# Patient Record
Sex: Female | Born: 1950 | Race: Black or African American | Hispanic: No | Marital: Married | Smoking: Former smoker
Health system: Southern US, Community
[De-identification: ages and names within clinical notes are randomized; demographics above are authoritative.]

## PROBLEM LIST (undated history)

## (undated) DIAGNOSIS — I2510 Atherosclerotic heart disease of native coronary artery without angina pectoris: Secondary | ICD-10-CM

## (undated) DIAGNOSIS — I10 Essential (primary) hypertension: Secondary | ICD-10-CM

## (undated) DIAGNOSIS — E119 Type 2 diabetes mellitus without complications: Secondary | ICD-10-CM

## (undated) HISTORY — PX: CORONARY ARTERY BYPASS GRAFT: SHX141

## (undated) HISTORY — PX: TONSILLECTOMY: SUR1361

---

## 2015-12-29 ENCOUNTER — Observation Stay (HOSPITAL_BASED_OUTPATIENT_CLINIC_OR_DEPARTMENT_OTHER)
Admission: EM | Admit: 2015-12-29 | Discharge: 2015-12-31 | Disposition: A | Discharge status: Home / Self Care | Payer: BLUE CROSS/BLUE SHIELD | Attending: Family Medicine | Admitting: Family Medicine

## 2015-12-29 ENCOUNTER — Encounter (HOSPITAL_BASED_OUTPATIENT_CLINIC_OR_DEPARTMENT_OTHER): Payer: Self-pay | Admitting: *Deleted

## 2015-12-29 ENCOUNTER — Emergency Department (HOSPITAL_BASED_OUTPATIENT_CLINIC_OR_DEPARTMENT_OTHER): Payer: BLUE CROSS/BLUE SHIELD

## 2015-12-29 DIAGNOSIS — Z951 Presence of aortocoronary bypass graft: Secondary | ICD-10-CM | POA: Diagnosis not present

## 2015-12-29 DIAGNOSIS — I2510 Atherosclerotic heart disease of native coronary artery without angina pectoris: Secondary | ICD-10-CM | POA: Insufficient documentation

## 2015-12-29 DIAGNOSIS — J1000 Influenza due to other identified influenza virus with unspecified type of pneumonia: Secondary | ICD-10-CM | POA: Diagnosis not present

## 2015-12-29 DIAGNOSIS — D649 Anemia, unspecified: Secondary | ICD-10-CM | POA: Diagnosis present

## 2015-12-29 DIAGNOSIS — Z8249 Family history of ischemic heart disease and other diseases of the circulatory system: Secondary | ICD-10-CM | POA: Diagnosis not present

## 2015-12-29 DIAGNOSIS — R509 Fever, unspecified: Secondary | ICD-10-CM | POA: Diagnosis present

## 2015-12-29 DIAGNOSIS — W19XXXA Unspecified fall, initial encounter: Secondary | ICD-10-CM | POA: Diagnosis not present

## 2015-12-29 DIAGNOSIS — Z87891 Personal history of nicotine dependence: Secondary | ICD-10-CM | POA: Diagnosis not present

## 2015-12-29 DIAGNOSIS — I10 Essential (primary) hypertension: Secondary | ICD-10-CM | POA: Insufficient documentation

## 2015-12-29 DIAGNOSIS — R55 Syncope and collapse: Secondary | ICD-10-CM | POA: Insufficient documentation

## 2015-12-29 DIAGNOSIS — Z7982 Long term (current) use of aspirin: Secondary | ICD-10-CM | POA: Diagnosis not present

## 2015-12-29 DIAGNOSIS — I4510 Unspecified right bundle-branch block: Secondary | ICD-10-CM | POA: Insufficient documentation

## 2015-12-29 DIAGNOSIS — E1165 Type 2 diabetes mellitus with hyperglycemia: Secondary | ICD-10-CM | POA: Diagnosis not present

## 2015-12-29 DIAGNOSIS — Z79899 Other long term (current) drug therapy: Secondary | ICD-10-CM | POA: Diagnosis not present

## 2015-12-29 DIAGNOSIS — S92322A Displaced fracture of second metatarsal bone, left foot, initial encounter for closed fracture: Secondary | ICD-10-CM | POA: Insufficient documentation

## 2015-12-29 DIAGNOSIS — E860 Dehydration: Secondary | ICD-10-CM | POA: Insufficient documentation

## 2015-12-29 DIAGNOSIS — S92332A Displaced fracture of third metatarsal bone, left foot, initial encounter for closed fracture: Secondary | ICD-10-CM | POA: Diagnosis not present

## 2015-12-29 DIAGNOSIS — N289 Disorder of kidney and ureter, unspecified: Secondary | ICD-10-CM | POA: Insufficient documentation

## 2015-12-29 DIAGNOSIS — Z794 Long term (current) use of insulin: Secondary | ICD-10-CM | POA: Insufficient documentation

## 2015-12-29 DIAGNOSIS — J189 Pneumonia, unspecified organism: Secondary | ICD-10-CM | POA: Insufficient documentation

## 2015-12-29 DIAGNOSIS — E785 Hyperlipidemia, unspecified: Secondary | ICD-10-CM | POA: Insufficient documentation

## 2015-12-29 HISTORY — DX: Essential (primary) hypertension: I10

## 2015-12-29 HISTORY — DX: Type 2 diabetes mellitus without complications: E11.9

## 2015-12-29 HISTORY — DX: Atherosclerotic heart disease of native coronary artery without angina pectoris: I25.10

## 2015-12-29 LAB — COMPREHENSIVE METABOLIC PANEL
ALT: 17 U/L (ref 14–54)
AST: 19 U/L (ref 15–41)
Albumin: 3.9 g/dL (ref 3.5–5.0)
Alkaline Phosphatase: 43 U/L (ref 38–126)
Anion gap: 11 (ref 5–15)
BUN: 22 mg/dL — AB (ref 6–20)
CHLORIDE: 102 mmol/L (ref 101–111)
CO2: 22 mmol/L (ref 22–32)
CREATININE: 0.84 mg/dL (ref 0.44–1.00)
Calcium: 8.9 mg/dL (ref 8.9–10.3)
GFR calc Af Amer: >60 mL/min (ref >60)
Glucose, Bld: 316 mg/dL — ABNORMAL HIGH (ref 65–99)
Potassium: 4.5 mmol/L (ref 3.5–5.1)
Sodium: 135 mmol/L (ref 135–145)
Total Bilirubin: 0.4 mg/dL (ref 0.3–1.2)
Total Protein: 7.8 g/dL (ref 6.5–8.1)

## 2015-12-29 LAB — CBC WITH DIFFERENTIAL/PLATELET
Basophils Absolute: 0 10*3/uL (ref 0.0–0.1)
Basophils Relative: 0 %
EOS ABS: 0 10*3/uL (ref 0.0–0.7)
EOS PCT: 0 %
HCT: 35.8 % — ABNORMAL LOW (ref 36.0–46.0)
Hemoglobin: 11.8 g/dL — ABNORMAL LOW (ref 12.0–15.0)
LYMPHS ABS: 0.9 10*3/uL (ref 0.7–4.0)
Lymphocytes Relative: 11 %
MCH: 27.9 pg (ref 26.0–34.0)
MCHC: 33 g/dL (ref 30.0–36.0)
MCV: 84.6 fL (ref 78.0–100.0)
MONOS PCT: 8 %
Monocytes Absolute: 0.6 10*3/uL (ref 0.1–1.0)
Neutro Abs: 6.2 10*3/uL (ref 1.7–7.7)
Neutrophils Relative %: 81 %
PLATELETS: 258 10*3/uL (ref 150–400)
RBC: 4.23 MIL/uL (ref 3.87–5.11)
RDW: 13.8 % (ref 11.5–15.5)
WBC: 7.7 10*3/uL (ref 4.0–10.5)

## 2015-12-29 LAB — URINALYSIS, ROUTINE W REFLEX MICROSCOPIC
Bilirubin Urine: NEGATIVE
GLUCOSE, UA: 500 mg/dL — AB
HGB URINE DIPSTICK: NEGATIVE
Ketones, ur: NEGATIVE mg/dL
Nitrite: NEGATIVE
PH: 5.5 (ref 5.0–8.0)
Protein, ur: NEGATIVE mg/dL
SPECIFIC GRAVITY, URINE: 1.013 (ref 1.005–1.030)

## 2015-12-29 LAB — URINE MICROSCOPIC-ADD ON

## 2015-12-29 LAB — I-STAT CG4 LACTIC ACID, ED: LACTIC ACID, VENOUS: 1.32 mmol/L (ref 0.5–2.0)

## 2015-12-29 LAB — CBG MONITORING, ED: Glucose-Capillary: 297 mg/dL — ABNORMAL HIGH (ref 65–99)

## 2015-12-29 MED ORDER — SODIUM CHLORIDE 0.9 % IV BOLUS (SEPSIS): 500.0000 mL | INTRAVENOUS | Status: DC

## 2015-12-29 MED ORDER — ACETAMINOPHEN 500 MG PO TABS: 1000.0000 mg | ORAL_TABLET | Freq: Once | ORAL | Status: DC

## 2015-12-29 MED ORDER — DEXTROSE 5 % IV SOLN
1.0000 g | Freq: Once | INTRAVENOUS | Status: AC
  Administered 2015-12-30: 1 g via INTRAVENOUS
  Filled 2015-12-29: qty 10

## 2015-12-29 MED ORDER — SODIUM CHLORIDE 0.9 % IV BOLUS (SEPSIS)
1000.0000 mL | INTRAVENOUS | Status: AC
  Administered 2015-12-29 (×2): 1000 mL via INTRAVENOUS

## 2015-12-29 MED ORDER — ACETAMINOPHEN 325 MG PO TABS
ORAL_TABLET | ORAL | Status: AC
  Filled 2015-12-29: qty 1

## 2015-12-29 MED ORDER — ACETAMINOPHEN 325 MG PO TABS
325.0000 mg | ORAL_TABLET | Freq: Once | ORAL | Status: AC
  Administered 2015-12-29: 325 mg via ORAL

## 2015-12-29 MED ORDER — ACETAMINOPHEN 325 MG PO TABS
650.0000 mg | ORAL_TABLET | Freq: Once | ORAL | Status: AC
  Administered 2015-12-29: 650 mg via ORAL
  Filled 2015-12-29: qty 2

## 2015-12-29 MED ORDER — DEXTROSE 5 % IV SOLN
500.0000 mg | Freq: Once | INTRAVENOUS | Status: AC
  Administered 2015-12-29: 500 mg via INTRAVENOUS

## 2015-12-29 MED ORDER — AZITHROMYCIN 500 MG IV SOLR
INTRAVENOUS | Status: AC
  Filled 2015-12-29: qty 500

## 2015-12-29 NOTE — Progress Notes (Signed)
[facility]. [facility] with weakness, fatigue, fevers, dry cough, and syncope x2. Febrile, but vitals o/w stable and saturating well on rm air. Labs with elevated ZOX:[id number]:creatinine ratio and hyperglycemia. CXR with mild vasc congestion, no infiltrate. Found to have 2 metatarsal fxs suffered during one of the syncopal episodes. [facility] with flu panel pending.

## 2015-12-29 NOTE — ED Notes (Addendum)
Pt reports feeling weak and dizzy the last few days. States she "went down" (?loc) last night while getting up to go to the bathroom and may have twisted her left foot. Unable to bear weight on left foot. Reports dry cough and fatigue. Temp 101.3 in triage

## 2015-12-29 NOTE — ED Provider Notes (Addendum)
CSN: [id number]     Arrival date & time 12/29/15  1958 History  By signing my name below, I, Rohini Rajnarayanan, attest that this documentation has been prepared under the direction and in the presence of Doug Sou, MD Electronically Signed: Charlean Merl, ED Scribe 12/29/2015 at 8:51 PM.   Chief Complaint  Patient presents with  . Leg Pain  . Weakness   The history is provided by the patient. No language interpreter was used.   HPI Comments: Susan Marquez is a 65 y.o. female with a pmhx of HTN, DM and CAD, with a pshx of CABG, who presents to the Emergency Department complaining of a dry cough, subjective fever, fatigue, HA across both eyes, weakness and light-headedness which began around a week ago and increased 2 days ago. Pt also c/o left foot pain after LOC x2 last night while getting up to go to the bathroom. Pt first lost consciousness during the night. Then she regained consciousness to use the bathroom. Once she sat on the commode, she fell and lost consciousness again. Pt states that she may have twisted her foot because she is unable to bear weight on her foot today. Pt is still feeling lightheaded and dehydrated. She feels much improved today over yesterday. No treatment prior to coming here. Pt denies any myalgias, dysuria, or abdominal pain. Pt denies any smoking or alcohol use. Pt's PCP is Dr. Jacob Moores at [facility].   Past Medical History  Diagnosis Date  . Hypertension   . Diabetes mellitus without complication (HCC)   . Coronary artery disease    Past Surgical History  Procedure Laterality Date  . Coronary artery bypass graft    . Tonsillectomy     No family history on file. Social History  Substance Use Topics  . Smoking status: Former Games developer  . Smokeless tobacco: Never Used  . Alcohol Use: No   OB History    No data available     Review of Systems  Constitutional: Positive for fever.  HENT: Negative.   Respiratory: Positive for  cough.   Cardiovascular: Negative.   Gastrointestinal: Negative.   Musculoskeletal: Positive for arthralgias.       Left foot pain after fall  Skin: Negative.   Allergic/Immunologic: Positive for immunocompromised state.  Neurological: Positive for light-headedness.  Psychiatric/Behavioral: Negative.   All other systems reviewed and are negative.   Allergies  Amlodipine  Home Medications   Prior to Admission medications   Medication Sig Start Date End Date Taking? Authorizing Provider  aspirin 325 MG tablet Take 325 mg by mouth daily.   Yes Historical Provider, MD  cloNIDine (CATAPRES) 0.2 MG tablet Take 0.2 mg by mouth 2 (two) times daily.   Yes Historical Provider, MD  fluticasone (FLONASE) 50 MCG/ACT nasal spray Place 2 sprays into both nostrils daily.   Yes Historical Provider, MD  gemfibrozil (LOPID) 600 MG tablet Take 600 mg by mouth 2 (two) times daily before a meal.   Yes Historical Provider, MD  glipiZIDE (GLUCOTROL) 10 MG tablet Take 10 mg by mouth daily before breakfast.   Yes Historical Provider, MD  insulin glargine (LANTUS) 100 UNIT/ML injection Inject into the skin every morning.   Yes Historical Provider, MD  lisinopril (PRINIVIL,ZESTRIL) 40 MG tablet Take 40 mg by mouth daily.   Yes Historical Provider, MD  metFORMIN (GLUCOPHAGE) 1000 MG tablet Take 1,000 mg by mouth 2 (two) times daily with a meal.   Yes Historical Provider, MD  metoprolol succinate (  TOPROL-XL) 25 MG 24 hr tablet Take 25 mg by mouth 2 (two) times daily.   Yes Historical Provider, MD  nitroGLYCERIN (NITROSTAT) 0.4 MG SL tablet Place 0.4 mg under the tongue every 5 (five) minutes as needed for chest pain.   Yes Historical Provider, MD  omeprazole (PRILOSEC) 20 MG capsule Take 20 mg by mouth daily.   Yes Historical Provider, MD   BP 174/80 mmHg  Pulse 101  Temp(Src) 101.3 F (38.5 C) (Oral)  Resp 20  Ht 5\' 4"  (1.626 m)  Wt 160 lb (72.576 kg)  BMI 27.45 kg/m2  SpO2 100% Physical Exam   Constitutional: She is oriented to person, place, and time. She appears well-developed and well-nourished. No distress.  HENT:  Head: Normocephalic and atraumatic.  Mouth/Throat: Oropharynx is clear and moist.  BiLateral tympanic membranes normal  Eyes: Conjunctivae are normal. Pupils are equal, round, and reactive to light.  Neck: Neck supple. No tracheal deviation present. No thyromegaly present.  No signs of meningitis  Cardiovascular: Normal rate and regular rhythm.   No murmur heard. Pulmonary/Chest: Effort normal and breath sounds normal.  Abdominal: Soft. Bowel sounds are normal. She exhibits no distension. There is no tenderness.  Musculoskeletal: Normal range of motion. She exhibits no edema or tenderness.  Neurological: She is alert and oriented to person, place, and time. No cranial nerve deficit. Coordination normal.  Skin: Skin is warm and dry. No rash noted.  Psychiatric: She has a normal mood and affect. Her behavior is normal. Thought content normal.  Nursing note and vitals reviewed.   ED Course  Procedures  DIAGNOSTIC STUDIES: Oxygen Saturation is 100% on RA, normal by my interpretation.    COORDINATION OF CARE: 8:42 PM-Discussed treatment plan which includes DG Chest, I-stat CG4 Lactic Acid, Blood work, UA, acetaminophen,  EKG, CBG monitoring, and cardiac monitoring, with pt at bedside and pt agreed to plan.   Labs Review Labs Reviewed  CBG MONITORING, ED    Imaging Review No results found. I have personally reviewed and evaluated these images and lab results as part of my medical decision-making.   EKG Interpretation None     ED ECG REPORT   Date: 12/29/2015  Rate: 90  Rhythm: normal sinus rhythm  QRS Axis: normal  Intervals: normal  ST/T Wave abnormalities: nonspecific T wave changes  Conduction Disutrbances:right bundle branch block  Narrative Interpretation:   Old EKG Reviewed: none available  I have personally reviewed the EKG tracing and  agree with the computerized printout as noted. Chest x-ray viewed by me Results for orders placed or performed during the hospital encounter of 12/29/15  Comprehensive metabolic panel  Result Value Ref Range   Sodium 135 135 - 145 mmol/L   Potassium 4.5 3.5 - 5.1 mmol/L   Chloride 102 101 - 111 mmol/L   CO2 22 22 - 32 mmol/L   Glucose, Bld 316 (H) 65 - 99 mg/dL   BUN 22 (H) 6 - 20 mg/dL   Creatinine, Ser 1.61 0.44 - 1.00 mg/dL   Calcium 8.9 8.9 - 09.6 mg/dL   Total Protein 7.8 6.5 - 8.1 g/dL   Albumin 3.9 3.5 - 5.0 g/dL   AST 19 15 - 41 U/L   ALT 17 14 - 54 U/L   Alkaline Phosphatase 43 38 - 126 U/L   Total Bilirubin 0.4 0.3 - 1.2 mg/dL   GFR calc non Af Amer >60 >60 mL/min   GFR calc Af Amer >60 >60 mL/min   Anion gap  11 5 - 15  CBC WITH DIFFERENTIAL  Result Value Ref Range   WBC 7.7 4.0 - 10.5 K/uL   RBC 4.23 3.87 - 5.11 MIL/uL   Hemoglobin 11.8 (L) 12.0 - 15.0 g/dL   HCT 45.4 (L) 09.8 - 11.9 %   MCV 84.6 78.0 - 100.0 fL   MCH 27.9 26.0 - 34.0 pg   MCHC 33.0 30.0 - 36.0 g/dL   RDW 14.7 82.9 - 56.2 %   Platelets 258 150 - 400 K/uL   Neutrophils Relative % 81 %   Neutro Abs 6.2 1.7 - 7.7 K/uL   Lymphocytes Relative 11 %   Lymphs Abs 0.9 0.7 - 4.0 K/uL   Monocytes Relative 8 %   Monocytes Absolute 0.6 0.1 - 1.0 K/uL   Eosinophils Relative 0 %   Eosinophils Absolute 0.0 0.0 - 0.7 K/uL   Basophils Relative 0 %   Basophils Absolute 0.0 0.0 - 0.1 K/uL  Urinalysis, Routine w reflex microscopic (not at [facility])  Result Value Ref Range   Color, Urine YELLOW YELLOW   APPearance CLEAR CLEAR   Specific Gravity, Urine 1.013 1.005 - 1.030   pH 5.5 5.0 - 8.0   Glucose, UA 500 (A) NEGATIVE mg/dL   Hgb urine dipstick NEGATIVE NEGATIVE   Bilirubin Urine NEGATIVE NEGATIVE   Ketones, ur NEGATIVE NEGATIVE mg/dL   Protein, ur NEGATIVE NEGATIVE mg/dL   Nitrite NEGATIVE NEGATIVE   Leukocytes, UA SMALL (A) NEGATIVE  Urine microscopic-add on  Result Value Ref Range   Squamous Epithelial  / LPF 0-5 (A) NONE SEEN   WBC, UA 0-5 0 - 5 WBC/hpf   RBC / HPF 0-5 0 - 5 RBC/hpf   Bacteria, UA RARE (A) NONE SEEN  CBG monitoring, ED  Result Value Ref Range   Glucose-Capillary 297 (H) 65 - 99 mg/dL  I-Stat CG4 Lactic Acid, ED  (not at  [facility])  Result Value Ref Range   Lactic Acid, Venous 1.32 0.5 - 2.0 mmol/L   Dg Chest 2 View  12/29/2015  CLINICAL DATA:  Acute onset of dry cough, subjective fever, fatigue, headache, generalized weakness and lightheadedness. Initial encounter. EXAM: CHEST  2 VIEW COMPARISON:  None. FINDINGS: The lungs are well-aerated. Mild bibasilar atelectasis is noted. Mild vascular congestion is seen. There is no evidence of focal opacification, pleural effusion or pneumothorax. The heart is normal in size; the patient is status post median sternotomy, with evidence of prior CABG. No acute osseous abnormalities are seen. IMPRESSION: Mild bibasilar atelectasis noted.  Mild vascular congestion seen. Electronically Signed   By: Roanna Raider M.D.   On: 12/29/2015 22:13   Ct Head Wo Contrast  12/29/2015  CLINICAL DATA:  Headaches and fever EXAM: CT HEAD WITHOUT CONTRAST TECHNIQUE: Contiguous axial images were obtained from the base of the skull through the vertex without intravenous contrast. COMPARISON:  None. FINDINGS: The bony calvarium is intact. The ventricles are of normal size and configuration. No findings to suggest acute hemorrhage, acute infarction or space-occupying mass lesion are noted. IMPRESSION: No acute intracranial abnormality noted. Electronically Signed   By: Alcide Clever M.D.   On: 12/29/2015 22:12   Dg Foot Complete Left  12/29/2015  CLINICAL DATA:  Recent fall with left foot pain, initial encounter EXAM: LEFT FOOT - COMPLETE 3+ VIEW COMPARISON:  None. FINDINGS: Fracture is noted through the base of the second and third metatarsals. Only minimal displacement is noted. No Lisfranc dislocation is noted. No other fractures are seen. IMPRESSION: Fractures  through the base of the second and third metatarsals with only minimal displacement. Electronically Signed   By: Alcide CleverMark  Lukens M.D.   On: 12/29/2015 22:13    MDM  In light of syncope, fractured foot as result of syncope, febrile illness in this immunocompromised I consulted with Dr.Opyd from hospitalist service. Telemetry She is treated for community-acquired pneumonia in light of cough. Flu panel pending. Patient is dehydrated in light of prerenal azotemia and hyperglycemia. Postop shoe placed on patient. Suggest orthopedic consult either as outpatient or while in hospital Final diagnoses:  None  Diagnosis #1 febrile illness #2 syncope #3 hyperglycemia #4 dehydration #5 renal insufficiency #6 closed fracture of left foot I personally performed the services described in this documentation, which was scribed in my presence. The recorded information has been reviewed and considered.       Doug SouSam Camala Talwar, MD 12/29/15 2340  Doug SouSam Tamirah George, MD 12/30/15 [phone]

## 2015-12-30 ENCOUNTER — Observation Stay (HOSPITAL_COMMUNITY): Payer: BLUE CROSS/BLUE SHIELD

## 2015-12-30 ENCOUNTER — Encounter (HOSPITAL_COMMUNITY): Payer: Self-pay | Admitting: Internal Medicine

## 2015-12-30 DIAGNOSIS — J189 Pneumonia, unspecified organism: Secondary | ICD-10-CM

## 2015-12-30 DIAGNOSIS — R55 Syncope and collapse: Secondary | ICD-10-CM

## 2015-12-30 DIAGNOSIS — E1165 Type 2 diabetes mellitus with hyperglycemia: Secondary | ICD-10-CM | POA: Diagnosis not present

## 2015-12-30 DIAGNOSIS — R509 Fever, unspecified: Secondary | ICD-10-CM

## 2015-12-30 DIAGNOSIS — D649 Anemia, unspecified: Secondary | ICD-10-CM | POA: Diagnosis present

## 2015-12-30 LAB — COMPREHENSIVE METABOLIC PANEL
ALBUMIN: 3.2 g/dL — AB (ref 3.5–5.0)
ALT: 14 U/L (ref 14–54)
ANION GAP: 9 (ref 5–15)
AST: 17 U/L (ref 15–41)
Alkaline Phosphatase: 37 U/L — ABNORMAL LOW (ref 38–126)
BUN: 9 mg/dL (ref 6–20)
CO2: 22 mmol/L (ref 22–32)
Calcium: 8.3 mg/dL — ABNORMAL LOW (ref 8.9–10.3)
Chloride: 109 mmol/L (ref 101–111)
Creatinine, Ser: 0.65 mg/dL (ref 0.44–1.00)
GFR calc Af Amer: >60 mL/min (ref >60)
GFR calc non Af Amer: >60 mL/min (ref >60)
GLUCOSE: 219 mg/dL — AB (ref 65–99)
POTASSIUM: 3.6 mmol/L (ref 3.5–5.1)
SODIUM: 140 mmol/L (ref 135–145)
Total Bilirubin: 0.2 mg/dL — ABNORMAL LOW (ref 0.3–1.2)
Total Protein: 6.2 g/dL — ABNORMAL LOW (ref 6.5–8.1)

## 2015-12-30 LAB — TROPONIN I: Troponin I: <0.03 ng/mL (ref <0.031)

## 2015-12-30 LAB — CBC WITH DIFFERENTIAL/PLATELET
Basophils Absolute: 0 10*3/uL (ref 0.0–0.1)
Basophils Relative: 0 %
Eosinophils Absolute: 0 10*3/uL (ref 0.0–0.7)
Eosinophils Relative: 0 %
HEMATOCRIT: 31.9 % — AB (ref 36.0–46.0)
Hemoglobin: 10.2 g/dL — ABNORMAL LOW (ref 12.0–15.0)
LYMPHS PCT: 37 %
Lymphs Abs: 1.7 10*3/uL (ref 0.7–4.0)
MCH: 26.9 pg (ref 26.0–34.0)
MCHC: 32 g/dL (ref 30.0–36.0)
MCV: 84.2 fL (ref 78.0–100.0)
MONO ABS: 0.4 10*3/uL (ref 0.1–1.0)
MONOS PCT: 9 %
NEUTROS ABS: 2.4 10*3/uL (ref 1.7–7.7)
Neutrophils Relative %: 54 %
Platelets: 215 10*3/uL (ref 150–400)
RBC: 3.79 MIL/uL — ABNORMAL LOW (ref 3.87–5.11)
RDW: 13.9 % (ref 11.5–15.5)
WBC: 4.5 10*3/uL (ref 4.0–10.5)

## 2015-12-30 LAB — INFLUENZA PANEL BY PCR (TYPE A & B)
H1N1FLUPCR: NOT DETECTED
Influenza A By PCR: NEGATIVE
Influenza B By PCR: POSITIVE — AB

## 2015-12-30 LAB — GLUCOSE, CAPILLARY
GLUCOSE-CAPILLARY: 211 mg/dL — AB (ref 65–99)
GLUCOSE-CAPILLARY: 86 mg/dL (ref 65–99)
GLUCOSE-CAPILLARY: 70 mg/dL (ref 65–99)
Glucose-Capillary: 196 mg/dL — ABNORMAL HIGH (ref 65–99)
Glucose-Capillary: 209 mg/dL — ABNORMAL HIGH (ref 65–99)

## 2015-12-30 LAB — STREP PNEUMONIAE URINARY ANTIGEN: Strep Pneumo Urinary Antigen: NEGATIVE

## 2015-12-30 MED ORDER — DEXTROSE 5 % IV SOLN
1.0000 g | INTRAVENOUS | Status: DC
  Filled 2015-12-30: qty 10

## 2015-12-30 MED ORDER — GEMFIBROZIL 600 MG PO TABS
600.0000 mg | ORAL_TABLET | Freq: Two times a day (BID) | ORAL | Status: DC
  Administered 2015-12-30 – 2015-12-31 (×3): 600 mg via ORAL
  Filled 2015-12-30 (×5): qty 1

## 2015-12-30 MED ORDER — FLUTICASONE PROPIONATE 50 MCG/ACT NA SUSP
2.0000 | Freq: Every day | NASAL | Status: DC
  Filled 2015-12-30: qty 16

## 2015-12-30 MED ORDER — ONDANSETRON HCL 4 MG/2ML IJ SOLN: 4.0000 mg | Freq: Four times a day (QID) | INTRAMUSCULAR | Status: DC | PRN

## 2015-12-30 MED ORDER — TRAMADOL HCL 50 MG PO TABS
50.0000 mg | ORAL_TABLET | Freq: Four times a day (QID) | ORAL | Status: DC
  Administered 2015-12-30 – 2015-12-31 (×4): 50 mg via ORAL
  Filled 2015-12-30 (×5): qty 1

## 2015-12-30 MED ORDER — CLONIDINE HCL 0.2 MG PO TABS
0.2000 mg | ORAL_TABLET | Freq: Two times a day (BID) | ORAL | Status: DC
  Administered 2015-12-30 – 2015-12-31 (×3): 0.2 mg via ORAL
  Filled 2015-12-30 (×3): qty 1

## 2015-12-30 MED ORDER — DIGOXIN 0.25 MG/ML IJ SOLN: 0.2500 mg | Freq: Once | INTRAMUSCULAR | Status: DC

## 2015-12-30 MED ORDER — PANTOPRAZOLE SODIUM 40 MG PO TBEC
40.0000 mg | DELAYED_RELEASE_TABLET | Freq: Every day | ORAL | Status: DC
  Administered 2015-12-30 – 2015-12-31 (×2): 40 mg via ORAL
  Filled 2015-12-30 (×2): qty 1

## 2015-12-30 MED ORDER — TRAMADOL HCL 50 MG PO TABS
50.0000 mg | ORAL_TABLET | Freq: Two times a day (BID) | ORAL | Status: DC | PRN
  Administered 2015-12-30: 50 mg via ORAL
  Filled 2015-12-30: qty 1

## 2015-12-30 MED ORDER — NITROGLYCERIN 0.4 MG SL SUBL: 0.4000 mg | SUBLINGUAL_TABLET | SUBLINGUAL | Status: DC | PRN

## 2015-12-30 MED ORDER — INSULIN ASPART 100 UNIT/ML ~~LOC~~ SOLN
0.0000 [IU] | Freq: Three times a day (TID) | SUBCUTANEOUS | Status: DC
  Administered 2015-12-30: 2 [IU] via SUBCUTANEOUS
  Administered 2015-12-30: 3 [IU] via SUBCUTANEOUS
  Administered 2015-12-31 (×2): 1 [IU] via SUBCUTANEOUS

## 2015-12-30 MED ORDER — SODIUM CHLORIDE 0.9 % IV BOLUS (SEPSIS)
500.0000 mL | Freq: Once | INTRAVENOUS | Status: AC
  Administered 2015-12-30: 500 mL via INTRAVENOUS

## 2015-12-30 MED ORDER — ONDANSETRON HCL 4 MG PO TABS: 4.0000 mg | ORAL_TABLET | Freq: Four times a day (QID) | ORAL | Status: DC | PRN

## 2015-12-30 MED ORDER — GLIPIZIDE ER 10 MG PO TB24
10.0000 mg | ORAL_TABLET | Freq: Every day | ORAL | Status: DC
  Administered 2015-12-30: 10 mg via ORAL
  Filled 2015-12-30: qty 1

## 2015-12-30 MED ORDER — INSULIN GLARGINE 100 UNIT/ML ~~LOC~~ SOLN
60.0000 [IU] | Freq: Every day | SUBCUTANEOUS | Status: DC
  Administered 2015-12-30 – 2015-12-31 (×2): 60 [IU] via SUBCUTANEOUS
  Filled 2015-12-30 (×2): qty 0.6

## 2015-12-30 MED ORDER — DIGOXIN 0.25 MG/ML IJ SOLN: 0.1250 mg | Freq: Every day | INTRAMUSCULAR | Status: DC

## 2015-12-30 MED ORDER — OXYCODONE-ACETAMINOPHEN 5-325 MG PO TABS
1.0000 | ORAL_TABLET | ORAL | Status: DC | PRN
  Administered 2015-12-30 – 2015-12-31 (×2): 1 via ORAL
  Filled 2015-12-30 (×2): qty 1

## 2015-12-30 MED ORDER — ACETAMINOPHEN 650 MG RE SUPP: 650.0000 mg | Freq: Four times a day (QID) | RECTAL | Status: DC | PRN

## 2015-12-30 MED ORDER — ACETAMINOPHEN 325 MG PO TABS: 650.0000 mg | ORAL_TABLET | Freq: Four times a day (QID) | ORAL | Status: DC | PRN

## 2015-12-30 MED ORDER — SODIUM CHLORIDE 0.9 % IV SOLN
INTRAVENOUS | Status: DC
Start: 2015-12-30 — End: 2015-12-30
  Administered 2015-12-30 (×2): via INTRAVENOUS

## 2015-12-30 MED ORDER — LISINOPRIL 40 MG PO TABS
40.0000 mg | ORAL_TABLET | Freq: Every day | ORAL | Status: DC
  Administered 2015-12-30 – 2015-12-31 (×2): 40 mg via ORAL
  Filled 2015-12-30 (×2): qty 1

## 2015-12-30 MED ORDER — ASPIRIN 325 MG PO TABS
325.0000 mg | ORAL_TABLET | Freq: Every day | ORAL | Status: DC
Start: 2015-12-30 — End: 2015-12-31
  Administered 2015-12-30 – 2015-12-31 (×2): 325 mg via ORAL
  Filled 2015-12-30 (×2): qty 1

## 2015-12-30 MED ORDER — DEXTROSE 5 % IV SOLN
500.0000 mg | INTRAVENOUS | Status: DC
  Filled 2015-12-30: qty 500

## 2015-12-30 MED ORDER — METOPROLOL SUCCINATE ER 25 MG PO TB24
25.0000 mg | ORAL_TABLET | Freq: Two times a day (BID) | ORAL | Status: DC
  Administered 2015-12-30 – 2015-12-31 (×3): 25 mg via ORAL
  Filled 2015-12-30 (×3): qty 1

## 2015-12-30 MED ORDER — ENOXAPARIN SODIUM 40 MG/0.4ML ~~LOC~~ SOLN
40.0000 mg | Freq: Every day | SUBCUTANEOUS | Status: DC
  Administered 2015-12-30 – 2015-12-31 (×2): 40 mg via SUBCUTANEOUS
  Filled 2015-12-30 (×2): qty 0.4

## 2015-12-30 MED ORDER — OSELTAMIVIR PHOSPHATE 75 MG PO CAPS
75.0000 mg | ORAL_CAPSULE | Freq: Every day | ORAL | Status: DC
Start: 2015-12-30 — End: 2015-12-31
  Administered 2015-12-30 – 2015-12-31 (×2): 75 mg via ORAL
  Filled 2015-12-30 (×3): qty 1

## 2015-12-30 NOTE — Progress Notes (Signed)
11:52 AM I agree with HPI/GPe and A/P per Dr. Dr. Toniann FailKakrakandy  65 y/o ? CAd s/p CABG 1994 [facility]-sees [facility]/ cardiology Lexiscan DM tyII on insulin Last A1c document to 11.25/2015 HTN    x-rays revealed fracture of the second and third metatarsal at the base.  EKG shows normal sinus rhythm with RBBB and QTC of 486 ms.  Patient was found to be febrile in the ER. Chest x-ray only shows congestion.  But patient has been having subjective feeling of fever and chills last 2 days with productive cough   Patient Active Problem List   Diagnosis Date Noted  . CAP (community acquired pneumonia) 12/30/2015  . Syncope 12/30/2015  . Normocytic anemia 12/30/2015  . Type 2 diabetes mellitus with hyperglycemia (HCC) 12/30/2015  . Febrile illness 12/29/2015   P FLU +-d/c Abx-only Tamiflu, d/c steroids Metatarsal #'s-Cam walker-OP ortho follow up needed 2-3 weeks for rpt films-will need OP management  ? Home am if ambulatory on cam-walker  Pleas KochJai Kevonna Nolte, MD [facility] [phone]

## 2015-12-30 NOTE — Progress Notes (Signed)
Pt will need another pain med, Tramadol is not working well, please address, Thanks Lavonda JumboMike F RN

## 2015-12-30 NOTE — H&P (Addendum)
[facility] History and Physical  Susan Marquez EAV:[id number] DOB: August 27, 1951 DOA: 12/29/2015  Referring physician: Patient was transferred from [facility] [place] [facility]. PCP: [facility]  Specialists: Patient follows up with [facility] cardiology. And also follows up with [facility].  Chief Complaint: Weakness loss of consciousness. Left foot pain.  HPI: Susan Marquez is a 65 y.o. female with history of CAD status post CABG the 90s, diabetes mellitus, hypertension, hyperlipidemia presents to the ER because patient has been having persistent left foot pain since she had a fall the day before yesterday. Patient states she had been feeling weak with subjective feeling of fever and chills over the last 1 week. Has been exam productive cough denies any chest pain or shortness of breath. On Saturday night 2 nights ago while walking in her house patient suddenly lost consciousness. Then she regained consciousness without any intervention and she went to the bathroom and came back when she again lost control and buckled but did not lose consciousness at this time. After she woke up yesterday morning she felt that her left foot was hurting and she came to the ER later. X-rays revealed fracture of the second and third metatarsal at the base. EKG shows normal sinus rhythm with RBBB and QTC of 486 ms. Patient was found to be febrile in the ER. Chest x-ray only shows congestion. But patient has been having subjective feeling of fever and chills last 2 days with productive cough. Patient was empirically given antibiotics for pneumonia and influenza PCR is pending. Patient otherwise denies any nausea vomiting diarrhea. Patient's blood sugar has been running high as per the patient.   Review of Systems: As presented in the history of presenting illness, rest negative.  Past Medical History  Diagnosis Date  . Hypertension   . Diabetes mellitus without complication (HCC)   . Coronary  artery disease    Past Surgical History  Procedure Laterality Date  . Coronary artery bypass graft    . Tonsillectomy     Social History:  reports that she has quit smoking. She has never used smokeless tobacco. She reports that she does not drink alcohol or use illicit drugs. Where does patient live Home. Can patient participate in ADLs? Yes.  Allergies  Allergen Reactions  . Amlodipine Other (See Comments)    Chest pain    Family History:  Family History  Problem Relation Age of Onset  . CAD Father       Prior to Admission medications   Medication Sig Start Date End Date Taking? Authorizing Provider  aspirin 325 MG tablet Take 325 mg by mouth daily.   Yes Historical Provider, MD  cloNIDine (CATAPRES) 0.2 MG tablet Take 0.2 mg by mouth 2 (two) times daily.   Yes Historical Provider, MD  fluticasone (FLONASE) 50 MCG/ACT nasal spray Place 2 sprays into both nostrils daily.   Yes Historical Provider, MD  gemfibrozil (LOPID) 600 MG tablet Take 600 mg by mouth 2 (two) times daily before a meal.   Yes Historical Provider, MD  glipiZIDE (GLUCOTROL) 10 MG tablet Take 10 mg by mouth daily before breakfast.   Yes Historical Provider, MD  insulin glargine (LANTUS) 100 UNIT/ML injection Inject into the skin every morning.   Yes Historical Provider, MD  lisinopril (PRINIVIL,ZESTRIL) 40 MG tablet Take 40 mg by mouth daily.   Yes Historical Provider, MD  metFORMIN (GLUCOPHAGE) 1000 MG tablet Take 1,000 mg by mouth 2 (two) times daily with a meal.  Yes Historical Provider, MD  metoprolol succinate (TOPROL-XL) 25 MG 24 hr tablet Take 25 mg by mouth 2 (two) times daily.   Yes Historical Provider, MD  nitroGLYCERIN (NITROSTAT) 0.4 MG SL tablet Place 0.4 mg under the tongue every 5 (five) minutes as needed for chest pain.   Yes Historical Provider, MD  omeprazole (PRILOSEC) 20 MG capsule Take 20 mg by mouth daily.   Yes Historical Provider, MD    Physical Exam: Filed Vitals:   12/30/15 0030  12/30/15 0134 12/30/15 0138 12/30/15 0140  BP: 141/68   163/59  Pulse: 75   75  Temp:    98.9 F (37.2 C)  TempSrc:    Oral  Resp: 21   20  Height:  5\' 4"  (1.626 m)    Weight:   162 lb 1.6 oz (73.528 kg)   SpO2: 100%   99%     General:  Moderately built and nourished.  Eyes: Anicteric no pallor.  ENT: No discharge from the ears eyes nose and mouth.  Neck: No mass felt. No JVD appreciated.  Cardiovascular: S1 and S2 heard.  Respiratory: No rhonchi or crepitations.  Abdomen: Soft nontender bowel sounds present.  Skin: No rash.  Musculoskeletal: Left foot in brace.  Psychiatric: Appears normal.  Neurologic: Alert awake oriented to time place and person. Moves all extremities.  Labs on Admission:  Basic Metabolic Panel:  Recent Labs Lab 12/29/15 2120  NA 135  K 4.5  CL 102  CO2 22  GLUCOSE 316*  BUN 22*  CREATININE 0.84  CALCIUM 8.9   Liver Function Tests:  Recent Labs Lab 12/29/15 2120  AST 19  ALT 17  ALKPHOS 43  BILITOT 0.4  PROT 7.8  ALBUMIN 3.9   No results for input(s): LIPASE, AMYLASE in the last 168 hours. No results for input(s): AMMONIA in the last 168 hours. CBC:  Recent Labs Lab 12/29/15 2120  WBC 7.7  NEUTROABS 6.2  HGB 11.8*  HCT 35.8*  MCV 84.6  PLT 258   Cardiac Enzymes: No results for input(s): CKTOTAL, CKMB, CKMBINDEX, TROPONINI in the last 168 hours.  BNP (last 3 results) No results for input(s): BNP in the last 8760 hours.  ProBNP (last 3 results) No results for input(s): PROBNP in the last 8760 hours.  CBG:  Recent Labs Lab 12/29/15 2017 12/30/15 0226  GLUCAP 297* 211*    Radiological Exams on Admission: Dg Chest 2 View  12/29/2015  CLINICAL DATA:  Acute onset of dry cough, subjective fever, fatigue, headache, generalized weakness and lightheadedness. Initial encounter. EXAM: CHEST  2 VIEW COMPARISON:  None. FINDINGS: The lungs are well-aerated. Mild bibasilar atelectasis is noted. Mild vascular congestion  is seen. There is no evidence of focal opacification, pleural effusion or pneumothorax. The heart is normal in size; the patient is status post median sternotomy, with evidence of prior CABG. No acute osseous abnormalities are seen. IMPRESSION: Mild bibasilar atelectasis noted.  Mild vascular congestion seen. Electronically Signed   By: Roanna RaiderJeffery  Chang M.D.   On: 12/29/2015 22:13   Ct Head Wo Contrast  12/29/2015  CLINICAL DATA:  Headaches and fever EXAM: CT HEAD WITHOUT CONTRAST TECHNIQUE: Contiguous axial images were obtained from the base of the skull through the vertex without intravenous contrast. COMPARISON:  None. FINDINGS: The bony calvarium is intact. The ventricles are of normal size and configuration. No findings to suggest acute hemorrhage, acute infarction or space-occupying mass lesion are noted. IMPRESSION: No acute intracranial abnormality noted. Electronically Signed  By: Alcide Clever M.D.   On: 12/29/2015 22:12   Dg Foot Complete Left  12/29/2015  CLINICAL DATA:  Recent fall with left foot pain, initial encounter EXAM: LEFT FOOT - COMPLETE 3+ VIEW COMPARISON:  None. FINDINGS: Fracture is noted through the base of the second and third metatarsals. Only minimal displacement is noted. No Lisfranc dislocation is noted. No other fractures are seen. IMPRESSION: Fractures through the base of the second and third metatarsals with only minimal displacement. Electronically Signed   By: Alcide Clever M.D.   On: 12/29/2015 22:13    EKG: Independently reviewed. Normal sinus rhythm with RBBB and QTC of 486 ms.  Assessment/Plan Principal Problem:   CAP (community acquired pneumonia) Active Problems:   Febrile illness   Syncope   Normocytic anemia   Type 2 diabetes mellitus with hyperglycemia (HCC)   1. Syncope - cause not known but since patient has been having some febrile illness and weakness could be orthostatic. Check orthostatic vitals. Gently hydrate. But since patient has cardiac history  we have to rule out arrhythmia as check 2-D echo. 2. Febrile illness probably from pneumonia - influenza PCR is pending. Follow blood cultures urine cultures I have also ordered urine for Legionella strep antigen. For now patient is empirically placed on ceftriaxone and Zithromax. Gently hydrate. 3. Diabetes mellitus type 2 uncontrolled with hyperglycemia - I have continued patient's long-acting insulin with sliding scale coverage. Closely follow CBGs. Patient states her blood sugar has been running high at home. Patient is also on glipizide. 4. Left foot second and third metatarsal fracture - non surgical management. 5. Hypertension - continue home medications. Patient is on lisinopril and Toprol-XL. 6. Normocytic normochromic anemia - follow CBC. 7. Hyperlipidemia on gemfibrozil.   DVT Prophylaxis Lovenox.  Code Status: Full code.  Family Communication: Discussed with patient and husband.  Disposition Plan: Admit for observation.    Zephaniah Lubrano N. [facility] Pager [phone].  If 7PM-7AM, please contact night-coverage [email or web address] Password [email or web address] 12/30/2015, 4:40 AM

## 2015-12-30 NOTE — Evaluation (Signed)
Physical Therapy Evaluation Patient Details Name: Susan Marquez MRN: [id number] DOB: Apr 28, 1951 Today's Date: 12/30/2015   History of Present Illness  65 y.o. female with a pmhx of HTN, DM and CAD, with a pshx of CABG, who presents s/p syncope with fall and resulting left foot fractures. Pt complaining of a dry cough, subjective fever, fatigue, HA across both eyes, weakness and light-headedness.  Clinical Impression  Patient demonstrates deficits in functional mobility as indicated below. Will benefit from continued skilled PT to address deficits and maximize function. Will see as indicated and progress as tolerated. Spoke with MD, MD to order CAM Walker boot for patient, at this time, no weight bearing restrictions noted.     Follow Up Recommendations Supervision/Assistance - 24 hour;Supervision for mobility/OOB    Equipment Recommendations  Rolling walker with 5" wheels    Recommendations for Other Services       Precautions / Restrictions Precautions Precautions: Fall Required Braces or Orthoses: Other Brace/Splint Other Brace/Splint: recommend use of cam walker      Mobility  Bed Mobility Overal bed mobility: Modified Independent             General bed mobility comments: increased time to perform  Transfers Overall transfer level: Needs assistance Equipment used: Rolling walker (2 wheeled) Transfers: Sit to/from Stand Sit to Stand: Min assist         General transfer comment: VCs for hand placement and technique with use of RW  Ambulation/Gait Ambulation/Gait assistance: Min guard Ambulation Distance (Feet): 16 Feet Assistive device: Rolling walker (2 wheeled) Gait Pattern/deviations: Step-to pattern;Decreased stride length;Trunk flexed     General Gait Details: increased pain with very minimal activity. Instructed on sequencing and technique for mobility with use of RW, Self non-weight bearing due to pain.   Stairs            Wheelchair Mobility    Modified Rankin (Stroke Patients Only)       Balance Overall balance assessment: History of Falls                                           Pertinent Vitals/Pain Pain Assessment: 0-10 Pain Score: 8  Pain Descriptors / Indicators: Discomfort;Sharp Pain Intervention(s): Limited activity within patient's tolerance    Home Living Family/patient expects to be discharged to:: Private residence Living Arrangements: Spouse/significant other Available Help at Discharge: Family Type of Home: House Home Access: Stairs to enter   Secretary/administrator of Steps: 3 Home Layout: Two level;Able to live on main level with bedroom/bathroom Home Equipment: None      Prior Function Level of Independence: Independent               Hand Dominance   Dominant Hand: Right    Extremity/Trunk Assessment   Upper Extremity Assessment: Overall WFL for tasks assessed           Lower Extremity Assessment: LLE deficits/detail         Communication   Communication: No difficulties  Cognition Arousal/Alertness: Awake/alert Behavior During Therapy: WFL for tasks assessed/performed Overall Cognitive Status: Within Functional Limits for tasks assessed                      General Comments General comments (skin integrity, edema, etc.): post op shoe donned on LLE, left foot with noted edema. Educated on elevation and RICE for management.  Exercises        Assessment/Plan    PT Assessment Patient needs continued PT services  PT Diagnosis Difficulty walking;Acute pain;Abnormality of gait   PT Problem List Decreased strength;Decreased activity tolerance;Decreased balance;Decreased mobility;Pain;Decreased safety awareness;Decreased knowledge of use of DME  PT Treatment Interventions DME instruction;Gait training;Stair training;Functional mobility training;Therapeutic exercise;Therapeutic activities;Patient/family education   PT Goals (Current goals  can be found in the Care Plan section) Acute Rehab PT Goals Patient Stated Goal: to go home PT Goal Formulation: With patient Time For Goal Achievement: 01/13/16 Potential to Achieve Goals: Good    Frequency Min 3X/week   Barriers to discharge        Co-evaluation               End of Session Equipment Utilized During Treatment: Gait belt Activity Tolerance: Patient limited by pain Patient left: in bed;with call bell/phone within reach;with family/visitor present;with bed alarm set Nurse Communication: Mobility status    Functional Assessment Tool Used: clinical judgement Functional Limitation: Mobility: Walking and moving around Mobility: Walking and Moving Around Current Status [phone](G8978): At least 20 percent but less than 40 percent impaired, limited or restricted Mobility: Walking and Moving Around Goal Status [phone](G8979): At least 1 percent but less than 20 percent impaired, limited or restricted    Time: 1157-1215 PT Time Calculation (min) (ACUTE ONLY): 18 min   Charges:   PT Evaluation $PT Eval Moderate Complexity: 1 Procedure     PT G Codes:   PT G-Codes **NOT FOR INPATIENT CLASS** Functional Assessment Tool Used: clinical judgement Functional Limitation: Mobility: Walking and moving around Mobility: Walking and Moving Around Current Status ([id number](G8978): At least 20 percent but less than 40 percent impaired, limited or restricted Mobility: Walking and Moving Around Goal Status [phone](G8979): At least 1 percent but less than 20 percent impaired, limited or restricted    Fabio AsaWerner, Quantay Zaremba J 12/30/2015, 1:51 PM Charlotte Crumbevon Brittanya Winburn, PT DPT  [phone]

## 2015-12-30 NOTE — Progress Notes (Signed)
Orthopedic Tech Progress Note Patient Details:  Susan Marquez 12/22/1950 [id number]  Ortho Devices Type of Ortho Device: CAM walker Ortho Device/Splint Interventions: Application   Saul FordyceJennifer C Lis Savitt 12/30/2015, 1:50 PM

## 2015-12-31 DIAGNOSIS — R55 Syncope and collapse: Secondary | ICD-10-CM | POA: Diagnosis not present

## 2015-12-31 LAB — COMPREHENSIVE METABOLIC PANEL
ALBUMIN: 2.9 g/dL — AB (ref 3.5–5.0)
ALK PHOS: 32 U/L — AB (ref 38–126)
ALT: 10 U/L — AB (ref 14–54)
AST: 17 U/L (ref 15–41)
Anion gap: 11 (ref 5–15)
BILIRUBIN TOTAL: 0.5 mg/dL (ref 0.3–1.2)
BUN: 9 mg/dL (ref 6–20)
CALCIUM: 8.5 mg/dL — AB (ref 8.9–10.3)
CO2: 20 mmol/L — ABNORMAL LOW (ref 22–32)
CREATININE: 0.63 mg/dL (ref 0.44–1.00)
Chloride: 107 mmol/L (ref 101–111)
GFR calc Af Amer: >60 mL/min (ref >60)
GLUCOSE: 139 mg/dL — AB (ref 65–99)
POTASSIUM: 4.1 mmol/L (ref 3.5–5.1)
Sodium: 138 mmol/L (ref 135–145)
TOTAL PROTEIN: 5.7 g/dL — AB (ref 6.5–8.1)

## 2015-12-31 LAB — URINE CULTURE: Culture: 7000

## 2015-12-31 LAB — CBC
HEMATOCRIT: 27.6 % — AB (ref 36.0–46.0)
Hemoglobin: 8.9 g/dL — ABNORMAL LOW (ref 12.0–15.0)
MCH: 27.4 pg (ref 26.0–34.0)
MCHC: 32.2 g/dL (ref 30.0–36.0)
MCV: 84.9 fL (ref 78.0–100.0)
PLATELETS: 194 10*3/uL (ref 150–400)
RBC: 3.25 MIL/uL — ABNORMAL LOW (ref 3.87–5.11)
RDW: 13.9 % (ref 11.5–15.5)
WBC: 3.4 10*3/uL — AB (ref 4.0–10.5)

## 2015-12-31 LAB — GLUCOSE, CAPILLARY
GLUCOSE-CAPILLARY: 140 mg/dL — AB (ref 65–99)
Glucose-Capillary: 150 mg/dL — ABNORMAL HIGH (ref 65–99)
Glucose-Capillary: 126 mg/dL — ABNORMAL HIGH (ref 65–99)

## 2015-12-31 LAB — LEGIONELLA PNEUMOPHILA SEROGP 1 UR AG: L. PNEUMOPHILA SEROGP 1 UR AG: NEGATIVE

## 2015-12-31 MED ORDER — OXYCODONE-ACETAMINOPHEN 5-325 MG PO TABS: 1.0000 | ORAL_TABLET | ORAL | Status: DC | PRN

## 2015-12-31 MED ORDER — TRAMADOL HCL 50 MG PO TABS: 100.0000 mg | ORAL_TABLET | Freq: Four times a day (QID) | ORAL | Status: DC

## 2015-12-31 MED ORDER — OSELTAMIVIR PHOSPHATE 75 MG PO CAPS: 75.0000 mg | ORAL_CAPSULE | Freq: Every day | ORAL | Status: DC

## 2015-12-31 NOTE — Discharge Summary (Signed)
Physician Discharge Summary  Susan Marquez BJY:[id number] DOB: 12-17-1950 DOA: 12/29/2015  PCP: [facility]  Admit date: 12/29/2015 Discharge date: 12/31/2015  Time spent: 45 minutes  Recommendations for Outpatient Follow-up:  1. Needs OP follow up Ortho-appt made 2. Needs to see PCP 3. Cam-walker given this admission, as well DME rolling walker 4. 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  EKG shows normal sinus rhythm with RBBB and QTC of 486 ms.  Patient was found to be febrile in the ER. Chest x-ray only shows congestion. But patient has been having subjective feeling of fever and chills last 2 days with productive cough  Diagnosed flu Echo cancelled Cam walker given-needs pain control and HH PT/OT Needs Ortho appt 1 week Needs follow up pcp 1 week   Discharge Exam: Filed Vitals:   12/30/15 2129 12/31/15 0533  BP: 145/57 110/69  Pulse: 72 62  Temp: 98.8 F (37.1 C) 98.6 F (37 C)  Resp:      General: eomi ncat Cardiovascular: s1 s2 no m/r/g Respiratory: clear  Discharge Instructions   Discharge Instructions    Diet - low sodium heart healthy    Complete by:  As directed      Discharge instructions    Complete by:  As directed   Complete tamiflu Follow up with ortho as scheudled -you will  need an xray this week Get both pain meds prescribed     Increase activity slowly    Complete by:  As directed           Current Discharge Medication List    START taking these medications   Details  oseltamivir (TAMIFLU) 75 MG capsule Take 1 capsule (75 mg total) by mouth daily. Qty: 3 capsule, Refills: 0    oxyCODONE-acetaminophen (PERCOCET/ROXICET) 5-325 MG tablet Take 1 tablet by mouth every 4 (four) hours as needed for severe pain. Qty: 40 tablet, Refills: 0    traMADol (ULTRAM) 50 MG tablet Take 2 tablets (100 mg total) by mouth 4 (four) times daily. Qty: 30 tablet, Refills: 0      CONTINUE these medications which have NOT CHANGED   Details  aspirin 325 MG tablet Take 325 mg by mouth daily.    atorvastatin (LIPITOR) 40 MG tablet Take 40 mg by mouth daily.    cloNIDine (CATAPRES) 0.2 MG tablet Take 0.2 mg by mouth 2 (two) times daily.    fluticasone (FLONASE) 50 MCG/ACT nasal spray Place 2 sprays into both nostrils daily as needed for allergies.     gemfibrozil (LOPID) 600 MG tablet Take 600 mg by mouth 2 (two) times daily before a meal.    GLIPIZIDE XL 10 MG 24 hr tablet Take 10 mg by mouth daily.    lisinopril (PRINIVIL,ZESTRIL) 40 MG tablet Take 40 mg by mouth daily.  metFORMIN (GLUCOPHAGE) 1000 MG tablet Take 1,000 mg by mouth 2 (two) times daily with a meal.    metoprolol succinate (TOPROL-XL) 25 MG 24 hr tablet Take 50 mg by mouth 2 (two) times daily.    nitroGLYCERIN (NITROSTAT) 0.4 MG SL tablet Place 0.4 mg under the tongue every 5 (five) minutes as needed for chest pain.    omeprazole (PRILOSEC) 20 MG capsule Take 20 mg by mouth daily as needed (heartburn).     TOUJEO SOLOSTAR 300 UNIT/ML SOPN Inject 60 Units into the skin every morning.      STOP taking these medications     insulin glargine (LANTUS) 100 UNIT/ML injection        Allergies  Allergen Reactions  . Amlodipine Other (See Comments)    Chest pain      The results of significant  diagnostics from this hospitalization (including imaging, microbiology, ancillary and laboratory) are listed below for reference.    Significant Diagnostic Studies: Dg Chest 2 View  12/29/2015  CLINICAL DATA:  Acute onset of dry cough, subjective fever, fatigue, headache, generalized weakness and lightheadedness. Initial encounter. EXAM: CHEST  2 VIEW COMPARISON:  None. FINDINGS: The lungs are well-aerated. Mild bibasilar atelectasis is noted. Mild vascular congestion is seen. There is no evidence of focal opacification, pleural effusion or pneumothorax. The heart is normal in size; the patient is status post median sternotomy, with evidence of prior CABG. No acute osseous abnormalities are seen. IMPRESSION: Mild bibasilar atelectasis noted.  Mild vascular congestion seen. Electronically Signed   By: Roanna Raider M.D.   On: 12/29/2015 22:13   Ct Head Wo Contrast  12/29/2015  CLINICAL DATA:  Headaches and fever EXAM: CT HEAD WITHOUT CONTRAST TECHNIQUE: Contiguous axial images were obtained from the base of the skull through the vertex without intravenous contrast. COMPARISON:  None. FINDINGS: The bony calvarium is intact. The ventricles are of normal size and configuration. No findings to suggest acute hemorrhage, acute infarction or space-occupying mass lesion are noted. IMPRESSION: No acute intracranial abnormality noted. Electronically Signed   By: Alcide Clever M.D.   On: 12/29/2015 22:12   Dg Foot Complete Left  12/29/2015  CLINICAL DATA:  Recent fall with left foot pain, initial encounter EXAM: LEFT FOOT - COMPLETE 3+ VIEW COMPARISON:  None. FINDINGS: Fracture is noted through the base of the second and third metatarsals. Only minimal displacement is noted. No Lisfranc dislocation is noted. No other fractures are seen. IMPRESSION: Fractures through the base of the second and third metatarsals with only minimal displacement. Electronically Signed   By: Alcide Clever M.D.   On: 12/29/2015 22:13     Microbiology: Recent Results (from the past 240 hour(s))  Blood Culture (routine x 2)     Status: None (Preliminary result)   Collection Time: 12/29/15  9:25 PM  Result Value Ref Range Status   Specimen Description BLOOD LEFT WRIST  Final   Special Requests   Final    BOTTLES DRAWN AEROBIC AND ANAEROBIC AER 5CC ANA 5CC   Culture PENDING  Incomplete   Report Status PENDING  Incomplete     Labs: Basic Metabolic Panel:  Recent Labs Lab 12/29/15 2120 12/30/15 0512 12/31/15 0447  NA 135 140 138  K 4.5 3.6 4.1  CL 102 109 107  CO2 22 22 20*  GLUCOSE 316* 219* 139*  BUN 22* 9 9  CREATININE 0.84 0.65 0.63  CALCIUM 8.9 8.3* 8.5*   Liver Function Tests:  Recent Labs Lab 12/29/15 2120  12/30/15 0512 12/31/15 0447  AST 19 17 17   ALT 17 14 10*  ALKPHOS 43 37* 32*  BILITOT 0.4 0.2* 0.5  PROT 7.8 6.2* 5.7*  ALBUMIN 3.9 3.2* 2.9*   No results for input(s): LIPASE, AMYLASE in the last 168 hours. No results for input(s): AMMONIA in the last 168 hours. CBC:  Recent Labs Lab 12/29/15 2120 12/30/15 0512 12/31/15 0447  WBC 7.7 4.5 3.4*  NEUTROABS 6.2 2.4  --   HGB 11.8* 10.2* 8.9*  HCT 35.8* 31.9* 27.6*  MCV 84.6 84.2 84.9  PLT 258 215 194   Cardiac Enzymes:  Recent Labs Lab 12/30/15 0512 12/30/15 1000 12/30/15 1622  TROPONINI <0.03 <0.03 <0.03   BNP: BNP (last 3 results) No results for input(s): BNP in the last 8760 hours.  ProBNP (last 3 results) No results for input(s): PROBNP in the last 8760 hours.  CBG:  Recent Labs Lab 12/30/15 1726 12/30/15 2242 12/31/15 12/31/15 0531 12/31/15 0637  GLUCAP 86 70 140* 150* 126*       Signed:  Rhetta MuraSAMTANI, JAI-GURMUKH MD   [facility] 12/31/2015, 8:55 AM

## 2015-12-31 NOTE — Progress Notes (Signed)
Physical Therapy Treatment Patient Details Name: Susan Marquez MRN: [id number] DOB: 12/06/1950 Today's Date: 12/31/2015    History of Present Illness 65 y.o. female with a pmhx of HTN, DM and CAD, with a pshx of CABG, who presents s/p syncope with fall and resulting left foot fractures. Pt complaining of a dry cough, subjective fever, fatigue, HA across both eyes, weakness and light-headedness.    PT Comments    Pt with improved mobility during PT session, ambulating 60 feet with rw and supervision. Pt declined performing stairs at this time but reviewed technique verbally with pt confirming understanding. Pt denies any questions or concerns.   Follow Up Recommendations  Supervision/Assistance - 24 hour;Supervision for mobility/OOB     Equipment Recommendations  Rolling walker with 5" wheels    Recommendations for Other Services       Precautions / Restrictions Precautions Precautions: Fall Required Braces or Orthoses: Other Brace/Splint Other Brace/Splint: cam boot    Mobility  Bed Mobility               General bed mobility comments: up in chair upon arrival  Transfers Overall transfer level: Needs assistance Equipment used: Rolling walker (2 wheeled) Transfers: Sit to/from Stand Sit to Stand: Supervision         General transfer comment: safe technique demonstrated.   Ambulation/Gait Ambulation/Gait assistance: Supervision Ambulation Distance (Feet): 60 Feet Assistive device: Rolling walker (2 wheeled) Gait Pattern/deviations: Step-through pattern;Decreased weight shift to left Gait velocity: decreased   General Gait Details: decreased weight bearing through LLE, wearing cam boot.l    Stairs Stairs:  (pt declined performing, reviewed stair technique.)          Wheelchair Mobility    Modified Rankin (Stroke Patients Only)       Balance Overall balance assessment: Needs assistance Sitting-balance support: No upper extremity  supported Sitting balance-Leahy Scale: Normal     Standing balance support: During functional activity Standing balance-Leahy Scale: Fair Standing balance comment: using rw                    Cognition Arousal/Alertness: Awake/alert Behavior During Therapy: WFL for tasks assessed/performed Overall Cognitive Status: Within Functional Limits for tasks assessed                      Exercises      General Comments        Pertinent Vitals/Pain Pain Assessment: 0-10 Pain Score: 6  Pain Location: Lt foot Pain Descriptors / Indicators: Cramping Pain Intervention(s): Limited activity within patient's tolerance;Monitored during session;Premedicated before session    Home Living                      Prior Function            PT Goals (current goals can now be found in the care plan section) Acute Rehab PT Goals Patient Stated Goal: to go home PT Goal Formulation: With patient Time For Goal Achievement: 01/13/16 Potential to Achieve Goals: Good Progress towards PT goals: Progressing toward goals    Frequency  Min 3X/week    PT Plan Current plan remains appropriate    Co-evaluation             End of Session   Activity Tolerance: Patient tolerated treatment well Patient left: in chair;with call bell/phone within reach     Time: [id number] PT Time Calculation (min) (ACUTE ONLY): 15 min  Charges:  $Gait Training: 8-22 mins  G Codes:      Christiane Ha, PT, CSCS Pager [phone] Office [phone]  12/31/2015, 11:18 AM

## 2015-12-31 NOTE — Care Management Note (Signed)
Case Management Note  Patient Details  Name: Marty Heckattie S Brenn MRN: [id number] Date of Birth: 08/14/1951  Subjective/Objective:         Admitted with Syncope           Action/Plan: Patient lives at home with her spouse, refused all HHC services, stated, " I don't need that." Patient request a rolling walker, [facility] called and will deliver the RW to rool today prior to discharging home.   Expected Discharge Date:   12/31/2015               Expected Discharge Plan:  Home/Self Care  Discharge planning Services  CM Consult Choice offered to:  Patient  DME Arranged:  Dan HumphreysWalker rolling DME Agency[facility]  [facility]  [facility]  [facility] Refused   Status of Service:  In process, will continue to follow  Reola MosherChandler, Dede Dobesh L, RN,MHA,BSN [phone]  12/31/2015, 12:51 PM

## 2015-12-31 NOTE — Progress Notes (Signed)
Orders received for pt discharge.  Discharge summary printed and reviewed with pt.  Explained medication regimen, and pt had no further questions at this time.  IV removed and site remains clean, dry, intact.  Telemetry removed.  Pt in stable condition and awaiting transport. 

## 2016-01-04 LAB — CULTURE, BLOOD (ROUTINE X 2)
CULTURE: NO GROWTH
Culture: NO GROWTH

## 2018-12-08 DIAGNOSIS — Z794 Long term (current) use of insulin: Secondary | ICD-10-CM | POA: Diagnosis not present

## 2018-12-08 DIAGNOSIS — E042 Nontoxic multinodular goiter: Secondary | ICD-10-CM | POA: Diagnosis not present

## 2018-12-08 DIAGNOSIS — D649 Anemia, unspecified: Secondary | ICD-10-CM | POA: Diagnosis not present

## 2018-12-08 DIAGNOSIS — Z5181 Encounter for therapeutic drug level monitoring: Secondary | ICD-10-CM | POA: Diagnosis not present

## 2018-12-08 DIAGNOSIS — Z79899 Other long term (current) drug therapy: Secondary | ICD-10-CM | POA: Diagnosis not present

## 2018-12-08 DIAGNOSIS — E782 Mixed hyperlipidemia: Secondary | ICD-10-CM | POA: Diagnosis not present

## 2018-12-08 DIAGNOSIS — E1142 Type 2 diabetes mellitus with diabetic polyneuropathy: Secondary | ICD-10-CM | POA: Diagnosis not present

## 2018-12-15 DIAGNOSIS — E1169 Type 2 diabetes mellitus with other specified complication: Secondary | ICD-10-CM | POA: Diagnosis not present

## 2018-12-15 DIAGNOSIS — E1142 Type 2 diabetes mellitus with diabetic polyneuropathy: Secondary | ICD-10-CM | POA: Diagnosis not present

## 2018-12-15 DIAGNOSIS — E1159 Type 2 diabetes mellitus with other circulatory complications: Secondary | ICD-10-CM | POA: Diagnosis not present

## 2018-12-15 DIAGNOSIS — R300 Dysuria: Secondary | ICD-10-CM | POA: Diagnosis not present

## 2019-01-30 ENCOUNTER — Other Ambulatory Visit: Payer: Self-pay

## 2019-01-30 NOTE — Patient Outreach (Signed)
  [facility]) Care Management Chronic Special Needs Program  01/30/2019  Name: Susan Marquez DOB: June 21, 1951  MRN: [id number]  Ms. Susan Marquez is enrolled in a Chronic Special Needs Plan. RNCM called to review health risk assessment and complete individualized care plan. No answer. HIPPA compliant message left.   Plan: Chronic care management coordinator will attempt outreach within 1-2 weeks if no return call.  Kathyrn Sheriff, RN, MSN, [facility] Chronic Care Management Coordinator [facility] [phone]

## 2019-02-07 ENCOUNTER — Other Ambulatory Visit: Payer: Self-pay

## 2019-02-07 NOTE — Patient Outreach (Signed)
  [facility]) Care Management Chronic Special Needs Program  02/07/2019  Name: Susan Marquez DOB: 1951/05/10  MRN: [id number]  Susan Marquez is enrolled in a Chronic Special Needs Plan. RNCM called to review health risk assessment and complete individualized care plan. No answer. HIPPA compliant message left. 2nd attempt.  Plan: Chronic care management coordinator will attempt outreach in 1-2 weeks.  Kathyrn Sheriff, RN, MSN, [facility] Chronic Care Management Coordinator [facility] [phone]

## 2019-02-08 DIAGNOSIS — Z794 Long term (current) use of insulin: Secondary | ICD-10-CM | POA: Diagnosis not present

## 2019-02-08 DIAGNOSIS — K5900 Constipation, unspecified: Secondary | ICD-10-CM | POA: Diagnosis not present

## 2019-02-08 DIAGNOSIS — Z5181 Encounter for therapeutic drug level monitoring: Secondary | ICD-10-CM | POA: Diagnosis not present

## 2019-02-08 DIAGNOSIS — E1142 Type 2 diabetes mellitus with diabetic polyneuropathy: Secondary | ICD-10-CM | POA: Diagnosis not present

## 2019-02-08 DIAGNOSIS — D509 Iron deficiency anemia, unspecified: Secondary | ICD-10-CM | POA: Diagnosis not present

## 2019-02-10 ENCOUNTER — Other Ambulatory Visit: Payer: Self-pay

## 2019-02-10 NOTE — Patient Outreach (Signed)
  [facility]) Care Management Chronic Special Needs Program  02/10/2019  Name: Susan Marquez DOB: [birth year]  MRN: [id number]  Ms. Susan Marquez is enrolled in a chronic special needs plan for Diabetes. Client has not responded to three outreach attempts.  The client's individualized care plan was developed based upon the completed health risk assessment.  Plan:  . Send unsuccessful outreach letter with a copy of individualized care plan to client . Send individualized care plan to provider . Send Agricultural engineer . Pharmacy referral-per health risk assessment sometimes goes without medications due to cost.  Chronic care management coordinator will attempt outreach in 6 Months.  Kathyrn Sheriff, RN, MSN, [facility] Chronic Care Management Coordinator [facility] [phone]

## 2019-02-14 ENCOUNTER — Telehealth: Payer: Self-pay | Admitting: Pharmacist

## 2019-02-14 NOTE — Patient Outreach (Signed)
[facility]) Care Management  02/14/2019  ZAILEIGH GREENAN 12-Nov-1950 [id number]   Patient was called regarding CSNP Medication Review. Unfortunately, she did not answer the phone. HIPAA compliant message was left with a female who answered the phone.  Plan: Call patient back in 10-14 business days. Send unsuccessful outreach letter.   Beecher Mcardle, PharmD, BCACP [facility] Clinical Pharmacist [phone]

## 2019-02-15 DIAGNOSIS — D500 Iron deficiency anemia secondary to blood loss (chronic): Secondary | ICD-10-CM | POA: Diagnosis not present

## 2019-02-15 DIAGNOSIS — K31819 Angiodysplasia of stomach and duodenum without bleeding: Secondary | ICD-10-CM | POA: Diagnosis not present

## 2019-02-15 DIAGNOSIS — K219 Gastro-esophageal reflux disease without esophagitis: Secondary | ICD-10-CM | POA: Diagnosis not present

## 2019-02-28 ENCOUNTER — Ambulatory Visit: Payer: Self-pay | Admitting: Pharmacist

## 2019-02-28 ENCOUNTER — Other Ambulatory Visit: Payer: Self-pay | Admitting: Pharmacist

## 2019-02-28 NOTE — Patient Outreach (Signed)
[facility]) Care Management  02/28/2019  MELL BRATTEN [birth year] [id number]   Patient was called regarding CSNP Medication Review. Unfortunately, she did not answer the phone. HIPAA compliant message was left with a female who answered the phone.  Unsuccessful outreach letter was sent 02/14/2019.  Today's call was the 2nd unsuccessful phone call.  Plan: Await a call back from the patient. Call patient back in 4-6 weeks.  Beecher Mcardle, PharmD, BCACP [facility] Clinical Pharmacist [phone]

## 2019-03-07 DIAGNOSIS — Z1211 Encounter for screening for malignant neoplasm of colon: Secondary | ICD-10-CM | POA: Diagnosis not present

## 2019-03-07 DIAGNOSIS — K219 Gastro-esophageal reflux disease without esophagitis: Secondary | ICD-10-CM | POA: Diagnosis not present

## 2019-03-07 DIAGNOSIS — K31819 Angiodysplasia of stomach and duodenum without bleeding: Secondary | ICD-10-CM | POA: Diagnosis not present

## 2019-03-07 DIAGNOSIS — K641 Second degree hemorrhoids: Secondary | ICD-10-CM | POA: Diagnosis not present

## 2019-03-07 DIAGNOSIS — D500 Iron deficiency anemia secondary to blood loss (chronic): Secondary | ICD-10-CM | POA: Diagnosis not present

## 2019-03-07 DIAGNOSIS — K5730 Diverticulosis of large intestine without perforation or abscess without bleeding: Secondary | ICD-10-CM | POA: Diagnosis not present

## 2019-04-04 ENCOUNTER — Other Ambulatory Visit: Payer: Self-pay | Admitting: Pharmacist

## 2019-04-04 NOTE — Patient Outreach (Signed)
[place] [facility]) Care Management  04/04/2019  Susan Marquez 09-10-1951 [id number]  Patient was called regarding medication assistance.  She answered the phone and HIPAA identifiers were obtained.  However, patient said she was cooking at the time of my call and asked that I call her back.    Patient was called back later in the afternoon but did not answer her phone.  HIPAA compliant message was left on her voicemail.  She was sent an unsuccessful outreach letter on 02/14/2019.  Today's call makes the 3rd unsuccessful phone call.  Plan: Call patient back in 3 months as she is part of CSNP and her case cannot be closed.  [name], PharmD, [facility] Clinical Pharmacist [phone]

## 2019-04-11 DIAGNOSIS — Z794 Long term (current) use of insulin: Secondary | ICD-10-CM | POA: Diagnosis not present

## 2019-04-11 DIAGNOSIS — E1142 Type 2 diabetes mellitus with diabetic polyneuropathy: Secondary | ICD-10-CM | POA: Diagnosis not present

## 2019-05-19 DIAGNOSIS — Z5181 Encounter for therapeutic drug level monitoring: Secondary | ICD-10-CM | POA: Diagnosis not present

## 2019-05-19 DIAGNOSIS — E041 Nontoxic single thyroid nodule: Secondary | ICD-10-CM | POA: Diagnosis not present

## 2019-05-19 DIAGNOSIS — I739 Peripheral vascular disease, unspecified: Secondary | ICD-10-CM | POA: Diagnosis not present

## 2019-05-19 DIAGNOSIS — E1169 Type 2 diabetes mellitus with other specified complication: Secondary | ICD-10-CM | POA: Diagnosis not present

## 2019-05-19 DIAGNOSIS — D509 Iron deficiency anemia, unspecified: Secondary | ICD-10-CM | POA: Diagnosis not present

## 2019-05-19 DIAGNOSIS — E1142 Type 2 diabetes mellitus with diabetic polyneuropathy: Secondary | ICD-10-CM | POA: Diagnosis not present

## 2019-05-19 DIAGNOSIS — Z794 Long term (current) use of insulin: Secondary | ICD-10-CM | POA: Diagnosis not present

## 2019-05-19 DIAGNOSIS — E1159 Type 2 diabetes mellitus with other circulatory complications: Secondary | ICD-10-CM | POA: Diagnosis not present

## 2019-05-24 DIAGNOSIS — I70202 Unspecified atherosclerosis of native arteries of extremities, left leg: Secondary | ICD-10-CM | POA: Diagnosis not present

## 2019-07-05 ENCOUNTER — Ambulatory Visit: Payer: Self-pay | Admitting: Pharmacist

## 2019-07-05 ENCOUNTER — Other Ambulatory Visit: Payer: Self-pay | Admitting: Pharmacist

## 2019-07-05 ENCOUNTER — Other Ambulatory Visit: Payer: Self-pay

## 2019-07-05 NOTE — Patient Outreach (Signed)
[place] [facility]) Care Management  07/05/2019  Susan Marquez 11-Apr-1951 [id number]   Patient's pharmacy case is being closed because she is no longer active the HTA CSNP.  Several unsuccessful phone calls have been made to the patient.  Plan: Route note to [place] nurse on patient's care team.  [name], PharmD, [facility] Clinical Pharmacist [phone]

## 2019-07-05 NOTE — Patient Outreach (Signed)
  [place] [facility]) Care Management Chronic Special Needs Program    07/05/2019  Name: Susan Marquez, DOB: [birth year]  MRN: [id number]   RNCM received notification that client has disenrolled from plan. RNCM will close case as client is no longer eligible for CSNP Chronic care management services.  Plan: close case.  Thea Silversmith, RN, MSN, [place] [phone]

## 2019-07-06 ENCOUNTER — Ambulatory Visit: Payer: Self-pay

## 2021-11-20 ENCOUNTER — Observation Stay (HOSPITAL_BASED_OUTPATIENT_CLINIC_OR_DEPARTMENT_OTHER): Payer: Medicare HMO

## 2021-11-20 ENCOUNTER — Other Ambulatory Visit: Payer: Self-pay

## 2021-11-20 ENCOUNTER — Observation Stay (HOSPITAL_BASED_OUTPATIENT_CLINIC_OR_DEPARTMENT_OTHER)
Admission: EM | Admit: 2021-11-20 | Discharge: 2021-11-21 | Disposition: A | Discharge status: Home / Self Care | Payer: Medicare HMO | Attending: Family Medicine | Admitting: Family Medicine

## 2021-11-20 ENCOUNTER — Emergency Department (HOSPITAL_BASED_OUTPATIENT_CLINIC_OR_DEPARTMENT_OTHER): Payer: Medicare HMO

## 2021-11-20 ENCOUNTER — Encounter (HOSPITAL_BASED_OUTPATIENT_CLINIC_OR_DEPARTMENT_OTHER): Payer: Self-pay | Admitting: Emergency Medicine

## 2021-11-20 DIAGNOSIS — Z951 Presence of aortocoronary bypass graft: Secondary | ICD-10-CM | POA: Insufficient documentation

## 2021-11-20 DIAGNOSIS — Z79899 Other long term (current) drug therapy: Secondary | ICD-10-CM | POA: Insufficient documentation

## 2021-11-20 DIAGNOSIS — Z7982 Long term (current) use of aspirin: Secondary | ICD-10-CM | POA: Insufficient documentation

## 2021-11-20 DIAGNOSIS — E7800 Pure hypercholesterolemia, unspecified: Secondary | ICD-10-CM | POA: Diagnosis not present

## 2021-11-20 DIAGNOSIS — Z87891 Personal history of nicotine dependence: Secondary | ICD-10-CM | POA: Insufficient documentation

## 2021-11-20 DIAGNOSIS — E119 Type 2 diabetes mellitus without complications: Secondary | ICD-10-CM | POA: Diagnosis not present

## 2021-11-20 DIAGNOSIS — I2510 Atherosclerotic heart disease of native coronary artery without angina pectoris: Secondary | ICD-10-CM

## 2021-11-20 DIAGNOSIS — I2583 Coronary atherosclerosis due to lipid rich plaque: Secondary | ICD-10-CM

## 2021-11-20 DIAGNOSIS — Z20822 Contact with and (suspected) exposure to covid-19: Secondary | ICD-10-CM | POA: Insufficient documentation

## 2021-11-20 DIAGNOSIS — R079 Chest pain, unspecified: Secondary | ICD-10-CM | POA: Diagnosis present

## 2021-11-20 DIAGNOSIS — M545 Low back pain, unspecified: Secondary | ICD-10-CM

## 2021-11-20 DIAGNOSIS — Z794 Long term (current) use of insulin: Secondary | ICD-10-CM | POA: Diagnosis not present

## 2021-11-20 DIAGNOSIS — Z7984 Long term (current) use of oral hypoglycemic drugs: Secondary | ICD-10-CM | POA: Diagnosis not present

## 2021-11-20 DIAGNOSIS — I10 Essential (primary) hypertension: Secondary | ICD-10-CM | POA: Diagnosis not present

## 2021-11-20 DIAGNOSIS — I200 Unstable angina: Secondary | ICD-10-CM | POA: Diagnosis not present

## 2021-11-20 LAB — CBC WITH DIFFERENTIAL/PLATELET
Abs Immature Granulocytes: 0.03 10*3/uL (ref 0.00–0.07)
Basophils Absolute: 0 10*3/uL (ref 0.0–0.1)
Basophils Relative: 1 %
Eosinophils Absolute: 0.2 10*3/uL (ref 0.0–0.5)
Eosinophils Relative: 2 %
HCT: 35.4 % — ABNORMAL LOW (ref 36.0–46.0)
Hemoglobin: 11.6 g/dL — ABNORMAL LOW (ref 12.0–15.0)
Immature Granulocytes: 0 %
Lymphocytes Relative: 34 %
Lymphs Abs: 2.9 10*3/uL (ref 0.7–4.0)
MCH: 28.2 pg (ref 26.0–34.0)
MCHC: 32.8 g/dL (ref 30.0–36.0)
MCV: 85.9 fL (ref 80.0–100.0)
Monocytes Absolute: 0.6 10*3/uL (ref 0.1–1.0)
Monocytes Relative: 7 %
Neutro Abs: 4.9 10*3/uL (ref 1.7–7.7)
Neutrophils Relative %: 56 %
Platelets: 288 10*3/uL (ref 150–400)
RBC: 4.12 MIL/uL (ref 3.87–5.11)
RDW: 13.6 % (ref 11.5–15.5)
WBC: 8.6 10*3/uL (ref 4.0–10.5)
nRBC: 0 % (ref 0.0–0.2)

## 2021-11-20 LAB — ECHOCARDIOGRAM COMPLETE
AR max vel: 1.96 cm2
AV Peak grad: 10.8 mmHg
Ao pk vel: 1.65 m/s
Area-P 1/2: 3.95 cm2
Height: 64 in
S' Lateral: 2.8 cm
Weight: 2528 oz

## 2021-11-20 LAB — RESP PANEL BY RT-PCR (FLU A&B, COVID) ARPGX2
Influenza A by PCR: NEGATIVE
Influenza B by PCR: NEGATIVE
SARS Coronavirus 2 by RT PCR: NEGATIVE

## 2021-11-20 LAB — BASIC METABOLIC PANEL
Anion gap: 7 (ref 5–15)
BUN: 19 mg/dL (ref 8–23)
CO2: 26 mmol/L (ref 22–32)
Calcium: 9.3 mg/dL (ref 8.9–10.3)
Chloride: 101 mmol/L (ref 98–111)
Creatinine, Ser: 0.79 mg/dL (ref 0.44–1.00)
GFR, Estimated: >60 mL/min (ref >60)
Glucose, Bld: 327 mg/dL — ABNORMAL HIGH (ref 70–99)
Potassium: 4.1 mmol/L (ref 3.5–5.1)
Sodium: 134 mmol/L — ABNORMAL LOW (ref 135–145)

## 2021-11-20 LAB — GLUCOSE, CAPILLARY
Glucose-Capillary: 190 mg/dL — ABNORMAL HIGH (ref 70–99)
Glucose-Capillary: 322 mg/dL — ABNORMAL HIGH (ref 70–99)
Glucose-Capillary: 142 mg/dL — ABNORMAL HIGH (ref 70–99)

## 2021-11-20 LAB — HIV ANTIBODY (ROUTINE TESTING W REFLEX): HIV Screen 4th Generation wRfx: NONREACTIVE

## 2021-11-20 LAB — TROPONIN I (HIGH SENSITIVITY)
Troponin I (High Sensitivity): 10 ng/L (ref <18)
Troponin I (High Sensitivity): 12 ng/L (ref <18)

## 2021-11-20 MED ORDER — ASPIRIN 81 MG PO CHEW
81.0000 mg | CHEWABLE_TABLET | Freq: Every day | ORAL | Status: DC
  Administered 2021-11-21: 81 mg via ORAL
  Filled 2021-11-20: qty 1

## 2021-11-20 MED ORDER — INSULIN ASPART 100 UNIT/ML IJ SOLN: 0.0000 [IU] | Freq: Every day | INTRAMUSCULAR | Status: DC

## 2021-11-20 MED ORDER — LISINOPRIL 20 MG PO TABS
40.0000 mg | ORAL_TABLET | Freq: Every day | ORAL | Status: DC
  Administered 2021-11-20 – 2021-11-21 (×2): 40 mg via ORAL
  Filled 2021-11-20: qty 4
  Filled 2021-11-20: qty 2

## 2021-11-20 MED ORDER — GEMFIBROZIL 600 MG PO TABS: 600.0000 mg | ORAL_TABLET | Freq: Two times a day (BID) | ORAL | Status: DC

## 2021-11-20 MED ORDER — ATORVASTATIN CALCIUM 10 MG PO TABS
20.0000 mg | ORAL_TABLET | Freq: Every day | ORAL | Status: DC
Start: 2021-11-20 — End: 2021-11-20

## 2021-11-20 MED ORDER — ATORVASTATIN CALCIUM 10 MG PO TABS
20.0000 mg | ORAL_TABLET | Freq: Every day | ORAL | Status: DC
  Administered 2021-11-20: 20 mg via ORAL
  Filled 2021-11-20: qty 2

## 2021-11-20 MED ORDER — METOPROLOL SUCCINATE ER 100 MG PO TB24: 100.0000 mg | ORAL_TABLET | Freq: Every day | ORAL | Status: DC

## 2021-11-20 MED ORDER — ONDANSETRON HCL 4 MG/2ML IJ SOLN: 4.0000 mg | Freq: Four times a day (QID) | INTRAMUSCULAR | Status: DC | PRN

## 2021-11-20 MED ORDER — VITAMIN D 25 MCG (1000 UNIT) PO TABS
1000.0000 [IU] | ORAL_TABLET | Freq: Every day | ORAL | Status: DC
  Administered 2021-11-20 – 2021-11-21 (×2): 1000 [IU] via ORAL
  Filled 2021-11-20 (×2): qty 1

## 2021-11-20 MED ORDER — IBUPROFEN 200 MG PO TABS: 400.0000 mg | ORAL_TABLET | Freq: Four times a day (QID) | ORAL | Status: DC | PRN

## 2021-11-20 MED ORDER — AMLODIPINE BESYLATE 10 MG PO TABS
10.0000 mg | ORAL_TABLET | Freq: Every day | ORAL | Status: DC
  Administered 2021-11-20 – 2021-11-21 (×2): 10 mg via ORAL
  Filled 2021-11-20 (×2): qty 1

## 2021-11-20 MED ORDER — METFORMIN HCL ER 500 MG PO TB24
1000.0000 mg | ORAL_TABLET | Freq: Two times a day (BID) | ORAL | Status: DC
  Administered 2021-11-20 (×2): 1000 mg via ORAL
  Filled 2021-11-20 (×5): qty 2

## 2021-11-20 MED ORDER — METFORMIN HCL ER 500 MG PO TB24: 1000.0000 mg | ORAL_TABLET | Freq: Two times a day (BID) | ORAL | Status: DC

## 2021-11-20 MED ORDER — ACETAMINOPHEN 325 MG PO TABS
650.0000 mg | ORAL_TABLET | ORAL | Status: DC | PRN
  Administered 2021-11-20: 650 mg via ORAL
  Filled 2021-11-20: qty 2

## 2021-11-20 MED ORDER — ACETAMINOPHEN ER 650 MG PO TBCR: 1300.0000 mg | EXTENDED_RELEASE_TABLET | Freq: Three times a day (TID) | ORAL | Status: DC | PRN

## 2021-11-20 MED ORDER — ATORVASTATIN CALCIUM 40 MG PO TABS: 40.0000 mg | ORAL_TABLET | Freq: Every day | ORAL | Status: DC

## 2021-11-20 MED ORDER — INSULIN ASPART 100 UNIT/ML IJ SOLN
0.0000 [IU] | Freq: Three times a day (TID) | INTRAMUSCULAR | Status: DC
  Administered 2021-11-20: 11 [IU] via SUBCUTANEOUS
  Administered 2021-11-21: 8 [IU] via SUBCUTANEOUS
  Administered 2021-11-21: 5 [IU] via SUBCUTANEOUS

## 2021-11-20 MED ORDER — ASPIRIN 325 MG PO TABS
325.0000 mg | ORAL_TABLET | Freq: Every day | ORAL | Status: DC
  Administered 2021-11-20: 325 mg via ORAL
  Filled 2021-11-20: qty 1

## 2021-11-20 MED ORDER — METOPROLOL SUCCINATE ER 100 MG PO TB24
100.0000 mg | ORAL_TABLET | Freq: Every day | ORAL | Status: DC
  Administered 2021-11-20: 100 mg via ORAL
  Filled 2021-11-20: qty 4

## 2021-11-20 MED ORDER — FERROUS SULFATE 325 (65 FE) MG PO TABS
325.0000 mg | ORAL_TABLET | Freq: Every day | ORAL | Status: DC
Start: 2021-11-21 — End: 2021-11-21
  Administered 2021-11-21: 325 mg via ORAL
  Filled 2021-11-20: qty 1

## 2021-11-20 MED ORDER — METOPROLOL SUCCINATE ER 25 MG PO TB24: 50.0000 mg | ORAL_TABLET | Freq: Two times a day (BID) | ORAL | Status: DC

## 2021-11-20 MED ORDER — CARVEDILOL 3.125 MG PO TABS: 3.1250 mg | ORAL_TABLET | Freq: Two times a day (BID) | ORAL | Status: DC

## 2021-11-20 MED ORDER — METFORMIN HCL 500 MG PO TABS: 1000.0000 mg | ORAL_TABLET | Freq: Two times a day (BID) | ORAL | Status: DC

## 2021-11-20 MED ORDER — HYDROCHLOROTHIAZIDE 25 MG PO TABS
25.0000 mg | ORAL_TABLET | Freq: Every day | ORAL | Status: DC
  Administered 2021-11-20 – 2021-11-21 (×2): 25 mg via ORAL
  Filled 2021-11-20 (×2): qty 1

## 2021-11-20 MED ORDER — CARVEDILOL 25 MG PO TABS
25.0000 mg | ORAL_TABLET | Freq: Two times a day (BID) | ORAL | Status: DC
  Administered 2021-11-20 – 2021-11-21 (×2): 25 mg via ORAL
  Filled 2021-11-20 (×2): qty 1

## 2021-11-20 MED ORDER — HYDROCODONE-ACETAMINOPHEN 5-325 MG PO TABS
1.0000 | ORAL_TABLET | Freq: Once | ORAL | Status: AC
  Administered 2021-11-20: 1 via ORAL
  Filled 2021-11-20: qty 1

## 2021-11-20 MED ORDER — LIRAGLUTIDE 18 MG/3ML ~~LOC~~ SOPN: 0.6000 mg | PEN_INJECTOR | Freq: Every day | SUBCUTANEOUS | Status: DC

## 2021-11-20 MED ORDER — ENOXAPARIN SODIUM 40 MG/0.4ML IJ SOSY
40.0000 mg | PREFILLED_SYRINGE | INTRAMUSCULAR | Status: DC
  Administered 2021-11-20: 40 mg via SUBCUTANEOUS
  Filled 2021-11-20 (×2): qty 0.4

## 2021-11-20 MED ORDER — CLONIDINE HCL 0.1 MG PO TABS: 0.2000 mg | ORAL_TABLET | Freq: Two times a day (BID) | ORAL | Status: DC

## 2021-11-20 MED ORDER — GLIPIZIDE ER 10 MG PO TB24: 10.0000 mg | ORAL_TABLET | Freq: Every day | ORAL | Status: DC

## 2021-11-20 NOTE — Progress Notes (Signed)
Inpatient Diabetes Program Recommendations  AACE/ADA: New Consensus Statement on Inpatient Glycemic Control (2015)  Target Ranges:  Prepandial:   less than 140 mg/dL      Peak postprandial:   less than 180 mg/dL (1-2 hours)      Critically ill patients:  140 - 180 mg/dL   Lab Results  Component Value Date   GLUCAP 126 (H) 12/31/2015    Review of Glycemic Control  Latest Reference Range & Units 11/20/21 04:29  Glucose 70 - 99 mg/dL 888 (H)  (H): Data is abnormally high Diabetes history: Type 2 DM Outpatient Diabetes medications: Glipizide 10 mg QD, Metformin 1000 mg BID, Toujeo 60 units QA Current orders for Inpatient glycemic control: Metformin 1000 mg BID  Inpatient Diabetes Program Recommendations:   Previous A1C 8.4% on 09/2021 Noted plan for admission.  Consider also adding:  -Semglee 30 units QA -Novolog 0-9 units TID & HS  Thanks, Lujean Rave, MSN, RNC-OB Diabetes Coordinator [phone] (8a-5p)

## 2021-11-20 NOTE — H&P (Signed)
History and Physical    Susan Marquez [id number] DOB: 1950/11/12 DOA: 11/20/2021  PCP: Francesca Oman, DO (Confirm with patient/family/NH records and if not entered, this has to be entered at [facility] point of entry) Patient coming from: Home  I have personally briefly reviewed patient's old medical records in [place]  Chief Complaint: Chest pain  HPI: Susan Marquez is a 71 y.o. female with medical history significant of CAD with CABG, PVD chronic claudication, IIDM, refractory HTN, HLD, came with 3 days of left-sided chest pain shoulder pain and arm pain.  Patient started to develop dull like lower back pain 3 days ago, localized, denied any urine or bowel movement problem no leg weakness.  Almost the same time, patient developed a exertional related left-sided chest pain radiated to left shoulder left side of neck and left upper arm with some tingling sensation of left forearm.  Episodic, 5 to 10 minutes each episode, associated with exertion, relieved by resting.  She described chest pain as " vague and annoying like", some episodes associated with feeling lightheadedness, denies any sweating nauseous vomiting or shortness of breath.  She says she also had 1 episode in the middle of the night 2 days ago.   She used to follow up with [facility] but recently switched due to insurance changes.  She had a normal stress test in 2020, but she does not remember whether she the stress test was because of chest pains.   She has chronic claudications follow-up with vascular at [facility], her symptoms has been stable.  ED Course: Blood pressure significant elevated, no tachycardia.  Troponin negative x2, EKG no acute ST changes.    Review of Systems: As per HPI otherwise 14 point review of systems negative.    Past Medical History:  Diagnosis Date   Coronary artery disease    Diabetes mellitus without complication ([place])    Hypertension     Past Surgical History:  Procedure  Laterality Date   CORONARY ARTERY BYPASS GRAFT     TONSILLECTOMY       reports that she has quit smoking. She has never used smokeless tobacco. She reports that she does not drink alcohol and does not use drugs.  Allergies  Allergen Reactions   Amlodipine Other (See Comments)    Chest pain - happened when pt first began taking. Has taken for the last year with no issues.    Semaglutide Nausea Only    Family History  Problem Relation Age of Onset   CAD Father      Prior to Admission medications   Medication Sig Start Date End Date Taking? Authorizing Provider  acetaminophen (TYLENOL) 650 MG CR tablet Take 1,300 mg by mouth every 8 (eight) hours as needed for pain.   Yes [provider]  amLODipine (NORVASC) 10 MG tablet Take 10 mg by mouth daily. 09/24/21  Yes [provider]  aspirin 325 MG tablet Take 325 mg by mouth at bedtime.   Yes [provider]  atorvastatin (LIPITOR) 20 MG tablet Take 20 mg by mouth at bedtime. 10/22/21  Yes [provider]  Cholecalciferol (VITAMIN D3 PO) Take 1 tablet by mouth daily.   Yes [provider]  Cyanocobalamin (VITAMIN B-12 PO) Take 1 tablet by mouth daily.   Yes [provider]  ferrous sulfate 325 (65 FE) MG tablet Take 325 mg by mouth daily with breakfast.   Yes [provider]  hydrochlorothiazide (HYDRODIURIL) 25 MG tablet  Take 25 mg by mouth daily. 10/02/21  Yes [provider]  ibuprofen (ADVIL) 200 MG tablet Take 400-600 mg by mouth every 6 (six) hours as needed for mild pain.   Yes [provider]  liraglutide (VICTOZA) 18 MG/3ML SOPN Inject 0.6 mg into the skin daily.   Yes [provider]  lisinopril (PRINIVIL,ZESTRIL) 40 MG tablet Take 40 mg by mouth daily.   Yes [provider]  metFORMIN (GLUCOPHAGE-XR) 500 MG 24 hr tablet Take 1,000 mg by mouth 2 (two) times daily. 10/16/21  Yes [provider]  metoprolol succinate (TOPROL-XL)  100 MG 24 hr tablet Take 100 mg by mouth daily. 11/03/21  Yes [provider]  nitroGLYCERIN (NITROSTAT) 0.4 MG SL tablet Place 0.4 mg under the tongue every 5 (five) minutes as needed for chest pain.   Yes [provider]  omeprazole (PRILOSEC) 20 MG capsule Take 20 mg by mouth daily as needed (heartburn).    Yes [provider]    Physical Exam: Vitals:   11/20/21 1057 11/20/21 1154 11/20/21 1258 11/20/21 1329  BP: (!) 145/62 (!) 146/88 (!) 150/72 (!) 152/60  Pulse: 66 66 64   Resp: 15 16 16    Temp:   97.6 F (36.4 C)   TempSrc:   Oral   SpO2: 100% 98%    Weight:      Height:        Constitutional: NAD, calm, comfortable Vitals:   11/20/21 1057 11/20/21 1154 11/20/21 1258 11/20/21 1329  BP: (!) 145/62 (!) 146/88 (!) 150/72 (!) 152/60  Pulse: 66 66 64   Resp: 15 16 16    Temp:   97.6 F (36.4 C)   TempSrc:   Oral   SpO2: 100% 98%    Weight:      Height:       Eyes: PERRL, lids and conjunctivae normal ENMT: Mucous membranes are moist. Posterior pharynx clear of any exudate or lesions.Normal dentition.  Neck: normal, supple, no masses, no thyromegaly Respiratory: clear to auscultation bilaterally, no wheezing, no crackles. Normal respiratory effort. No accessory muscle use.  Cardiovascular: Regular rate and rhythm, no murmurs / rubs / gallops. No extremity edema. 2+ pedal pulses. No carotid bruits.  2+ symmetrical bilateral radial pulses Abdomen: no tenderness, no masses palpated. No hepatosplenomegaly. Bowel sounds positive.  Musculoskeletal: no clubbing / cyanosis. No joint deformity upper and lower extremities. Good ROM, no contractures. Normal muscle tone.  Skin: no rashes, lesions, ulcers. No induration Neurologic: CN 2-12 grossly intact. Sensation intact, DTR normal. Strength 5/5 in all 4.  Psychiatric: Normal judgment and insight. Alert and oriented x 3. Normal mood.     Labs on Admission: I have personally reviewed following labs and imaging  studies  CBC: Recent Labs  Lab 11/20/21 0429  WBC 8.6  NEUTROABS 4.9  HGB 11.6*  HCT 35.4*  MCV 85.9  PLT [id number]   Basic Metabolic Panel: Recent Labs  Lab 11/20/21 0429  NA 134*  K 4.1  CL 101  CO2 26  GLUCOSE 327*  BUN 19  CREATININE 0.79  CALCIUM 9.3   GFR: Estimated Creatinine Clearance: 65.3 mL/min (by C-G formula based on SCr of 0.79 mg/dL). Liver Function Tests: No results for input(s): AST, ALT, ALKPHOS, BILITOT, PROT, ALBUMIN in the last 168 hours. No results for input(s): LIPASE, AMYLASE in the last 168 hours. No results for input(s): AMMONIA in the last 168 hours. Coagulation Profile: No results for input(s): INR, PROTIME in the last 168 hours.  Cardiac Enzymes: No results for input(s): CKTOTAL, CKMB, CKMBINDEX, TROPONINI in the last 168 hours. BNP (last 3 results) No results for input(s): PROBNP in the last 8760 hours. HbA1C: No results for input(s): HGBA1C in the last 72 hours. CBG: Recent Labs  Lab 11/20/21 1311  GLUCAP 190*   Lipid Profile: No results for input(s): CHOL, HDL, LDLCALC, TRIG, CHOLHDL, LDLDIRECT in the last 72 hours. Thyroid Function Tests: No results for input(s): TSH, T4TOTAL, FREET4, T3FREE, THYROIDAB in the last 72 hours. Anemia Panel: No results for input(s): VITAMINB12, FOLATE, FERRITIN, TIBC, IRON, RETICCTPCT in the last 72 hours. Urine analysis:    Component Value Date/Time   COLORURINE YELLOW 12/29/2015 [place] 12/29/2015 2254   LABSPEC 1.013 12/29/2015 2254   PHURINE 5.5 12/29/2015 2254   GLUCOSEU 500 (A) 12/29/2015 2254   HGBUR NEGATIVE 12/29/2015 2254   BILIRUBINUR NEGATIVE 12/29/2015 2254   KETONESUR NEGATIVE 12/29/2015 2254   PROTEINUR NEGATIVE 12/29/2015 2254   NITRITE NEGATIVE 12/29/2015 2254   LEUKOCYTESUR SMALL (A) 12/29/2015 2254    Radiological Exams on Admission: DG Lumbar Spine Complete  Result Date: 11/20/2021 CLINICAL DATA:  Low back pain EXAM: LUMBAR SPINE - COMPLETE 4+ VIEW  COMPARISON:  None. FINDINGS: Generalized lumbar spine degeneration with disc collapse and endplate spurring greatest at L3-4 and L4-5. No evidence of fracture, bone lesion, or endplate erosion. Osteopenia and extensive atheromatous calcification. 9 mm stone projecting over the right kidney. IMPRESSION: No acute or focal finding. Generalized lumbar spine degeneration. Electronically Signed   By: Jorje Guild M.D.   On: 11/20/2021 04:38    EKG: Independently reviewed.  Sinus, no acute ST changes.  Assessment/Plan Principal Problem:   Chest pain  (please populate well all problems here in Problem List. (For example, if patient is on BP meds at home and you resume or decide to hold them, it is a problem that needs to be her. Same for CAD, COPD, HLD and so on)  Chest pain -Differential including angina and also suspicious for subclavian steal syndrome. -She had CAD and CABG more than 20 years ago.  ACS ruled out with negative troponin x2 and no acute EKG changes.  Discussed with cardiology, echo ordered, cardiology to determine further ischemic work-up. -For possible subclavian steal syndrome, she does have underlying risk factor for PVD, will discuss with cardiology after ischemic work-up (if negative).  Cardiology does not recommend coronary artery CT angiogram to rule out subclavian stenosis. -Other Ddx, dissection unlikely given the history and clinical feature and equal pulses.  HTN, uncontrolled -Continue amlodipine, continue HCTZ and lisinopril -Change metoprolol to Coreg and further titrate according to response.  Back pain -Acute, no red flag signs -Lumbar spine x-ray showed diffused degeneration changes.  Recommend p.o. pain management and outpatient follow-up with PCP.  PVD -Continue statin and aspirin  HLD -Continue statin  DVT prophylaxis: Lovenox Code Status: Full code Family Communication: None at bedside Disposition Plan: Expect less than 2 midnight hospital  stay Consults called: Cardiology Admission status: Telemetry observation   Lequita Halt MD [facility] Pager [phone]  11/20/2021, 2:56 PM

## 2021-11-20 NOTE — ED Notes (Signed)
Bedside report given to [facility] RN with [facility]

## 2021-11-20 NOTE — ED Notes (Signed)
Report called to [employer] RN, receiving nurse at [facility]

## 2021-11-20 NOTE — ED Triage Notes (Signed)
Pt c/o lower back pain pain and left sided neck and shoulder pain x 3 days. Also reports diarrhea.

## 2021-11-20 NOTE — ED Provider Notes (Signed)
[facility] Provider Note: Susan Dell, MD, FACEP  CSN: [id number] MRN: [id number] ARRIVAL: 11/20/21 at 0117 ROOM: MH04/MH04   CHIEF COMPLAINT  Back Pain   HISTORY OF PRESENT ILLNESS  11/20/21 3:55 AM Susan Marquez is a 71 y.o. female who has had low back pain for the past 3 days.  The back pain is bilateral without radiation to the posterior thigh.  She has been taking Tylenol and ibuprofen without adequate relief.  She rates her pain as a 9 out of 10, worse with movement.  She is having no new numbness or weakness in her legs.  For the past 3 days she has also been having episodes of pain in the left side of her neck radiating to her left shoulder and to her left upper chest.  These episodes last about 10 minutes and nothing makes them better or worse.  They can occur at rest.  There is no change with movement of her neck.  She feels short of breath when these episodes occur.   Past Medical History:  Diagnosis Date   Coronary artery disease    Diabetes mellitus without complication (HCC)    Hypertension     Past Surgical History:  Procedure Laterality Date   CORONARY ARTERY BYPASS GRAFT     TONSILLECTOMY      Family History  Problem Relation Age of Onset   CAD Father     Social History   Tobacco Use   Smoking status: Former   Smokeless tobacco: Never  Substance Use Topics   Alcohol use: No   Drug use: No    Prior to Admission medications   Medication Sig Start Date End Date Taking? Authorizing Provider  aspirin 325 MG tablet Take 325 mg by mouth daily.    [provider]  atorvastatin (LIPITOR) 40 MG tablet Take 40 mg by mouth daily. 12/24/15   [provider]  cloNIDine (CATAPRES) 0.2 MG tablet Take 0.2 mg by mouth 2 (two) times daily.    [provider]  fluticasone (FLONASE) 50 MCG/ACT nasal spray Place 2 sprays into both nostrils daily as needed for allergies.     [provider]  gemfibrozil (LOPID) 600 MG  tablet Take 600 mg by mouth 2 (two) times daily before a meal.    [provider]  GLIPIZIDE XL 10 MG 24 hr tablet Take 10 mg by mouth daily. 12/24/15   [provider]  lisinopril (PRINIVIL,ZESTRIL) 40 MG tablet Take 40 mg by mouth daily.    [provider]  metFORMIN (GLUCOPHAGE) 1000 MG tablet Take 1,000 mg by mouth 2 (two) times daily with a meal.    [provider]  metoprolol succinate (TOPROL-XL) 25 MG 24 hr tablet Take 50 mg by mouth 2 (two) times daily. 12/20/15   [provider]  nitroGLYCERIN (NITROSTAT) 0.4 MG SL tablet Place 0.4 mg under the tongue every 5 (five) minutes as needed for chest pain.    [provider]  omeprazole (PRILOSEC) 20 MG capsule Take 20 mg by mouth daily as needed (heartburn).     [provider]  oseltamivir (TAMIFLU) 75 MG capsule Take 1 capsule (75 mg total) by mouth daily. 12/31/15   Rhetta Mura, MD  oxyCODONE-acetaminophen (PERCOCET/ROXICET) 5-325 MG tablet Take 1 tablet by mouth every 4 (four) hours as needed for severe pain. 12/31/15   Rhetta Mura, MD  TOUJEO SOLOSTAR 300 UNIT/ML SOPN Inject 60 Units into the skin every morning. 12/23/15  [provider]  traMADol (ULTRAM) 50 MG tablet Take 2 tablets (100 mg total) by mouth 4 (four) times daily. 12/31/15   Rhetta Mura, MD    Allergies Amlodipine   REVIEW OF SYSTEMS  Negative except as noted here or in the History of Present Illness.   PHYSICAL EXAMINATION  Initial Vital Signs Blood pressure (!) 186/81, pulse 84, temperature 97.9 F (36.6 C), temperature source Oral, resp. rate 20, height 5\' 4"  (1.626 m), weight 71.7 kg, SpO2 100 %.  Examination General: Well-developed, well-nourished female in no acute distress; appearance consistent with age of record HENT: normocephalic; atraumatic Eyes: Normal appearance Neck: supple; no pain with movement of neck Heart: regular rate and rhythm Lungs: clear to  auscultation bilaterally Abdomen: soft; nondistended; nontender; bowel sounds present Back: Nontender; pain with movement of lower back Extremities: No deformity; full range of motion Neurologic: Awake, alert and oriented; motor function intact in all extremities and symmetric; no facial droop Skin: Warm and dry Psychiatric: Normal mood and affect   RESULTS  Summary of this visit's results, reviewed and interpreted by myself:   EKG Interpretation  Date/Time:  Thursday November 20 2021 04:28:13 EST Ventricular Rate:  66 PR Interval:  207 QRS Duration: 92 QT Interval:  538 QTC Calculation: 564 R Axis:   27 Text Interpretation: Sinus rhythm Nonspecific T abnrm, anterolateral leads Prolonged QT interval RBBB no longer present Confirmed by Paula Libra ([id number]) on 11/20/2021 4:33:29 AM       Laboratory Studies: Results for orders placed or performed during the hospital encounter of 11/20/21 (from the past 24 hour(s))  Troponin I (High Sensitivity)     Status: None   Collection Time: 11/20/21  4:29 AM  Result Value Ref Range   Troponin I (High Sensitivity) 10 <18 ng/L  CBC with Differential/Platelet     Status: Abnormal   Collection Time: 11/20/21  4:29 AM  Result Value Ref Range   WBC 8.6 4.0 - 10.5 K/uL   RBC 4.12 3.87 - 5.11 MIL/uL   Hemoglobin 11.6 (L) 12.0 - 15.0 g/dL   HCT 35.3 (L) 29.9 - 24.2 %   MCV 85.9 80.0 - 100.0 fL   MCH 28.2 26.0 - 34.0 pg   MCHC 32.8 30.0 - 36.0 g/dL   RDW 68.3 41.9 - 62.2 %   Platelets 288 150 - 400 K/uL   nRBC 0.0 0.0 - 0.2 %   Neutrophils Relative % 56 %   Neutro Abs 4.9 1.7 - 7.7 K/uL   Lymphocytes Relative 34 %   Lymphs Abs 2.9 0.7 - 4.0 K/uL   Monocytes Relative 7 %   Monocytes Absolute 0.6 0.1 - 1.0 K/uL   Eosinophils Relative 2 %   Eosinophils Absolute 0.2 0.0 - 0.5 K/uL   Basophils Relative 1 %   Basophils Absolute 0.0 0.0 - 0.1 K/uL   Immature Granulocytes 0 %   Abs Immature Granulocytes 0.03 0.00 - 0.07 K/uL  Basic metabolic  panel     Status: Abnormal   Collection Time: 11/20/21  4:29 AM  Result Value Ref Range   Sodium 134 (L) 135 - 145 mmol/L   Potassium 4.1 3.5 - 5.1 mmol/L   Chloride 101 98 - 111 mmol/L   CO2 26 22 - 32 mmol/L   Glucose, Bld 327 (H) 70 - 99 mg/dL   BUN 19 8 - 23 mg/dL   Creatinine, Ser 2.97 0.44 - 1.00 mg/dL   Calcium 9.3 8.9 - 98.9 mg/dL   GFR, Estimated >  60 >60 mL/min   Anion gap 7 5 - 15   Imaging Studies: DG Lumbar Spine Complete  Result Date: 11/20/2021 CLINICAL DATA:  Low back pain EXAM: LUMBAR SPINE - COMPLETE 4+ VIEW COMPARISON:  None. FINDINGS: Generalized lumbar spine degeneration with disc collapse and endplate spurring greatest at L3-4 and L4-5. No evidence of fracture, bone lesion, or endplate erosion. Osteopenia and extensive atheromatous calcification. 9 mm stone projecting over the right kidney. IMPRESSION: No acute or focal finding. Generalized lumbar spine degeneration. Electronically Signed   By: Tiburcio Pea M.D.   On: 11/20/2021 04:38    ED COURSE and MDM  Nursing notes, initial and subsequent vitals signs, including pulse oximetry, reviewed and interpreted by myself.  Vitals:   11/20/21 0128 11/20/21 0430  BP: (!) 186/81 (!) 144/61  Pulse: 84 66  Resp: 20 17  Temp: 97.9 F (36.6 C) 98.1 F (36.7 C)  TempSrc: Oral Oral  SpO2: 100% 100%  Weight: 71.7 kg   Height: 5\' 4"  (1.626 m)    Medications  HYDROcodone-acetaminophen (NORCO/VICODIN) 5-325 MG per tablet 1 tablet (1 tablet Oral Given 11/20/21 0432)   5:26 AM The patient's EKG is not showing acute ischemic changes and her first troponin is within normal limits.  However, the symptoms she describes are very similar to the symptoms she developed that was ultimately diagnosed with coronary artery disease that she underwent a CABG.  I am concerned that this represents unstable angina, with neck pain being her anginal equivalent.  It is interesting to note that this pain does radiate down into the chest as well  and is associated with shortness of breath.   6:14 AM Dr. 11/22/21 excepts for admission to the hospitalist service at [facility]  PROCEDURES  Procedures   ED DIAGNOSES     ICD-10-CM   1. Unstable angina (HCC)  I20.0     2. Acute bilateral low back pain without sciatica  M54.50          Martrell Eguia, [facility], MD 11/20/21 [phone]

## 2021-11-20 NOTE — Consult Note (Signed)
Cardiology Consultation:   Patient ID: Susan Marquez MRN: [id number]; DOB: [birth year]  Admit date: 11/20/2021 Date of Consult: 11/20/2021  PCP:  Francesca Oman, DO   [facility] Providers Cardiologist:  None   {   Patient Profile:   Susan Marquez is a 71 y.o. female with a hx of peripheral arterial occlusive disease, CAD s/p CABG in 1994, type 2 DM, HTN, HLD, who is being seen 11/20/2021 for the evaluation of chest pain at the request of Dr. Roosevelt Locks.  History of Present Illness:   Susan Marquez is a 71 year old female with above medical history, historically followed by [facility]. Per chart review, patient underwent a CABGx3 in 1994. In December 2019, patient was seen in the ED for evaluation of right-sided chest wall pain. Patient was admitted for further workup. Underwent NM stress test on 09/29/2018 that was negative.   Patient presented to the ED on 2/23 complaining of lower back pain, left sided neck and shoulder pain, diarrhea for the past 3 days. Labs in the ED showed Na 134, K 4.1, creatinine 0.79, eGFR>60, hemoglobin 11.6, WBC 8.6. HSTN 10>>12. EKG showed sinus rhythm, HR 66, prolonged QT interval (538 ms). X-ray lumbar spine showed generalized lumbar spine degeneration.   On interview, patient reports that she went to the ED due to severe lower back pain. She also reports that she has had left arm and neck aches intermittently for the past month. Pain was previously infrequent, but has been happening more often. Associated with SOB, palpitations. Pain mostly occurs on exertion, but also occurs when patient is resting. Has been under some stress since her sister died 18 month ago.  Patient had a CABG in 1993, reports feeling similar symptoms at that time. Reports good BP control at home. Denies tobacco use (stopped smoking cigarettes in 1993). Fairly active lifestyle. Father had CAD.   Past Medical History:  Diagnosis Date   Coronary artery disease    Diabetes mellitus  without complication ([place])    Hypertension     Past Surgical History:  Procedure Laterality Date   CORONARY ARTERY BYPASS GRAFT     TONSILLECTOMY       Home Medications:  Prior to Admission medications   Medication Sig Start Date End Date Taking? Authorizing Provider  acetaminophen (TYLENOL) 650 MG CR tablet Take 1,300 mg by mouth every 8 (eight) hours as needed for pain.   Yes [provider]  amLODipine (NORVASC) 10 MG tablet Take 10 mg by mouth daily. 09/24/21  Yes [provider]  aspirin 325 MG tablet Take 325 mg by mouth at bedtime.   Yes [provider]  atorvastatin (LIPITOR) 20 MG tablet Take 20 mg by mouth at bedtime. 10/22/21  Yes [provider]  Cholecalciferol (VITAMIN D3 PO) Take 1 tablet by mouth daily.   Yes [provider]  Cyanocobalamin (VITAMIN B-12 PO) Take 1 tablet by mouth daily.   Yes [provider]  ferrous sulfate 325 (65 FE) MG tablet Take 325 mg by mouth daily with breakfast.   Yes [provider]  hydrochlorothiazide (HYDRODIURIL) 25 MG tablet Take 25 mg by mouth daily. 10/02/21  Yes [provider]  ibuprofen (ADVIL) 200 MG tablet Take 400-600 mg by mouth every 6 (six) hours as needed for mild pain.   Yes [provider]  liraglutide (VICTOZA) 18 MG/3ML SOPN Inject 0.6 mg into the skin daily.   Yes [provider]  lisinopril (PRINIVIL,ZESTRIL) 40 MG  tablet Take 40 mg by mouth daily.   Yes [provider]  metFORMIN (GLUCOPHAGE-XR) 500 MG 24 hr tablet Take 1,000 mg by mouth 2 (two) times daily. 10/16/21  Yes [provider]  metoprolol succinate (TOPROL-XL) 100 MG 24 hr tablet Take 100 mg by mouth daily. 11/03/21  Yes [provider]  nitroGLYCERIN (NITROSTAT) 0.4 MG SL tablet Place 0.4 mg under the tongue every 5 (five) minutes as needed for chest pain.   Yes [provider]  omeprazole (PRILOSEC) 20 MG capsule Take 20 mg by mouth daily as  needed (heartburn).    Yes [provider]    Inpatient Medications: Scheduled Meds:  aspirin  325 mg Oral Daily   atorvastatin  20 mg Oral Daily   lisinopril  40 mg Oral Daily   metFORMIN  1,000 mg Oral BID WC   metoprolol succinate  100 mg Oral Daily   Continuous Infusions:  PRN Meds:   Allergies:    Allergies  Allergen Reactions   Amlodipine Other (See Comments)    Chest pain - happened when pt first began taking. Has taken for the last year with no issues.    Semaglutide Nausea Only    Social History:   Social History   Socioeconomic History   Marital status: Married    Spouse name: Not on file   Number of children: Not on file   Years of education: Not on file   Highest education level: Not on file  Occupational History   Not on file  Tobacco Use   Smoking status: Former   Smokeless tobacco: Never  Substance and Sexual Activity   Alcohol use: No   Drug use: No   Sexual activity: Not on file  Other Topics Concern   Not on file  Social History Narrative   Not on file   Social Determinants of Health   Financial Resource Strain: Not on file  Food Insecurity: Not on file  Transportation Needs: Not on file  Physical Activity: Not on file  Stress: Not on file  Social Connections: Not on file  Intimate Partner Violence: Not on file    Family History:    Family History  Problem Relation Age of Onset   CAD Father      ROS:  Please see the history of present illness.   All other ROS reviewed and negative.     Physical Exam/Data:   Vitals:   11/20/21 1057 11/20/21 1154 11/20/21 1258 11/20/21 1329  BP: (!) 145/62 (!) 146/88 (!) 150/72 (!) 152/60  Pulse: 66 66 64   Resp: '15 16 16   ' Temp:   97.6 F (36.4 C)   TempSrc:   Oral   SpO2: 100% 98%    Weight:      Height:       No intake or output data in the 24 hours ending 11/20/21 1347 Last 3 Weights 11/20/2021 12/31/2015 12/30/2015  Weight (lbs) 158 lb 158 lb 9.6 oz 162 lb 1.6 oz  Weight  (kg) 71.668 kg 71.94 kg 73.528 kg     Body mass index is 27.12 kg/m.  General:  Well nourished, well developed, in no acute distress HEENT: normal Neck: no JVD. Neck muscles feel tight on both sides of neck. No tenderness over cervical spine.  Vascular: Radial pulses 2+ bilaterally Cardiac:  normal S1, S2; RRR; no murmur  Lungs:  clear to auscultation bilaterally, no wheezing, rhonchi or rales  Abd: soft, nontender, no hepatomegaly  Ext:  no edema Musculoskeletal:  No deformities. Upper arm strength equal bilaterally. No tenderness over bilateral upper extremities. No tenderness over thoracic or lumbar spine.  Skin: warm and dry  Neuro:  CNs 2-12 intact, no focal abnormalities noted Psych:  Normal affect   EKG:  The EKG was personally reviewed and demonstrates:  EKG showed sinus rhythm, HR 66, prolonged QT interval (538 ms) Telemetry:  Telemetry was personally reviewed and demonstrates:  Sinus Rhythm, HR in the 60s  Relevant CV Studies:   Laboratory Data:  High Sensitivity Troponin:   Recent Labs  Lab 11/20/21 0429 11/20/21 0614  TROPONINIHS 10 12     Chemistry Recent Labs  Lab 11/20/21 0429  NA 134*  K 4.1  CL 101  CO2 26  GLUCOSE 327*  BUN 19  CREATININE 0.79  CALCIUM 9.3  GFRNONAA >60  ANIONGAP 7    No results for input(s): PROT, ALBUMIN, AST, ALT, ALKPHOS, BILITOT in the last 168 hours. Lipids No results for input(s): CHOL, TRIG, HDL, LABVLDL, LDLCALC, CHOLHDL in the last 168 hours.  Hematology Recent Labs  Lab 11/20/21 0429  WBC 8.6  RBC 4.12  HGB 11.6*  HCT 35.4*  MCV 85.9  MCH 28.2  MCHC 32.8  RDW 13.6  PLT 288   Thyroid No results for input(s): TSH, FREET4 in the last 168 hours.  BNPNo results for input(s): BNP, PROBNP in the last 168 hours.  DDimer No results for input(s): DDIMER in the last 168 hours.   Radiology/Studies:  DG Lumbar Spine Complete  Result Date: 11/20/2021 CLINICAL DATA:  Low back pain EXAM: LUMBAR SPINE - COMPLETE 4+  VIEW COMPARISON:  None. FINDINGS: Generalized lumbar spine degeneration with disc collapse and endplate spurring greatest at L3-4 and L4-5. No evidence of fracture, bone lesion, or endplate erosion. Osteopenia and extensive atheromatous calcification. 9 mm stone projecting over the right kidney. IMPRESSION: No acute or focal finding. Generalized lumbar spine degeneration. Electronically Signed   By: Jorje Guild M.D.   On: 11/20/2021 04:38     Assessment and Plan:   Left Neck and Arm Pain  CAD s/p CABG 1993  HTN HLD Patient reports that pain is similar to pain experienced prior to CABG in 1993. Patient compared pain to arthritis. Worse on exertion, but also occurs at rest. Associated with SOB, palpitations. Patient is under some stress since sister passed away unexpectedly 1 month ago.  - Patient has a history of CABG in 1993. Denies any heart attacks or heart catheterizations since then. Had a normal stress test in January 2020 - EKG showed sinus rhythm, HR 66, prolonged QT interval (538 ms). No signs of ischemia  - HSTN 10>>12 - Echocardiogram ordered - With negative troponins, no ischemic changes on EKG, this is not ACS. However, with patient having similar symptoms prior to CABG, may be worth ischemic eval. If echocardiogram shows WMA, consider cath. If echocardiogram normal, consider stress test (possibly can be completed outpatient)  - Continue amlodipine 10 mg, hydrochlorothiazide 25 mg, metoprolol succinate 100 mg, lisinopril 40 mg for BP management  - Continue daily asa  - Continue statin therapy   Otherwise per primary  - Low back pain  - DM   Risk Assessment/Risk Scores:   HEAR Score (for undifferentiated chest pain):  HEAR Score: 5{       For questions or updates, please contact [place] Please consult [email or web address] for contact info under    Signed, Margie Billet, PA-C  11/20/2021 1:47 PM

## 2021-11-20 NOTE — Progress Notes (Signed)
Echocardiogram 2D Echocardiogram has been performed.  Susan Marquez 11/20/2021, 3:23 PM

## 2021-11-20 NOTE — Progress Notes (Signed)
Plan of Care Note for accepted transfer   Patient: Susan Marquez MRN: [id number]   DOA: 11/20/2021  Facility requesting transfer: [facility]  Requesting Provider: Dr. Florina Ou Reason for transfer: Chest Pain Facility course:  71 year old female with past medical history of coronary artery disease status post CABG, hypertension, hyperlipidemia and diabetes mellitus type 2 who initially presented to [place] [facility] emergency department with complaints of back pain.  During the patient's work-up the patient also began to complain of symptoms of neck pain, left shoulder pain and left arm pain that she states are the same symptoms that she experienced before she needed a CABG in the past.  EKG reveals no ST segment change.  Initial troponin is unremarkable.  ER provider is still concerned that despite the initial reassuring work-up he feels that she is high enough risk that she warrants observation in the hospital with serial cardiac enzymes and cardiology consultation.  Plan of care: The patient is accepted for admission to Telemetry unit, at [facility]..    Author: Vernelle Emerald, MD 11/20/2021  Check [email or web address] for on-call coverage.  Nursing staff, Please call [place] number on Amion as soon as patient's arrival, so appropriate admitting provider can evaluate the pt.

## 2021-11-21 ENCOUNTER — Observation Stay (HOSPITAL_BASED_OUTPATIENT_CLINIC_OR_DEPARTMENT_OTHER): Payer: Medicare HMO

## 2021-11-21 DIAGNOSIS — I2583 Coronary atherosclerosis due to lipid rich plaque: Secondary | ICD-10-CM | POA: Diagnosis not present

## 2021-11-21 DIAGNOSIS — M542 Cervicalgia: Secondary | ICD-10-CM

## 2021-11-21 DIAGNOSIS — I200 Unstable angina: Secondary | ICD-10-CM | POA: Diagnosis not present

## 2021-11-21 DIAGNOSIS — I25110 Atherosclerotic heart disease of native coronary artery with unstable angina pectoris: Secondary | ICD-10-CM | POA: Diagnosis not present

## 2021-11-21 DIAGNOSIS — R079 Chest pain, unspecified: Secondary | ICD-10-CM | POA: Diagnosis not present

## 2021-11-21 DIAGNOSIS — I10 Essential (primary) hypertension: Secondary | ICD-10-CM | POA: Diagnosis not present

## 2021-11-21 DIAGNOSIS — M79602 Pain in left arm: Secondary | ICD-10-CM

## 2021-11-21 DIAGNOSIS — I2510 Atherosclerotic heart disease of native coronary artery without angina pectoris: Secondary | ICD-10-CM | POA: Diagnosis not present

## 2021-11-21 DIAGNOSIS — E7800 Pure hypercholesterolemia, unspecified: Secondary | ICD-10-CM | POA: Diagnosis not present

## 2021-11-21 LAB — NM MYOCAR MULTI W/SPECT W/WALL MOTION / EF
Base ST Depression (mm): 0 mm
Estimated workload: 1
Exercise duration (min): 5 min
Exercise duration (sec): 18 s
MPHR: 152 {beats}/min
Nuc Stress EF: 66 %
Peak HR: 83 {beats}/min
Percent HR: 54 %
RPE: 6
Rest HR: 67 {beats}/min
ST Depression (mm): 0 mm

## 2021-11-21 LAB — HEMOGLOBIN A1C
Hgb A1c MFr Bld: 9.4 % — ABNORMAL HIGH (ref 4.8–5.6)
Mean Plasma Glucose: 223 mg/dL

## 2021-11-21 LAB — GLUCOSE, CAPILLARY
Glucose-Capillary: 225 mg/dL — ABNORMAL HIGH (ref 70–99)
Glucose-Capillary: 252 mg/dL — ABNORMAL HIGH (ref 70–99)

## 2021-11-21 MED ORDER — TECHNETIUM TC 99M TETROFOSMIN IV KIT
31.5000 | PACK | Freq: Once | INTRAVENOUS | Status: AC | PRN
  Administered 2021-11-21: 31.5 via INTRAVENOUS

## 2021-11-21 MED ORDER — TECHNETIUM TC 99M TETROFOSMIN IV KIT
10.0000 | PACK | Freq: Once | INTRAVENOUS | Status: AC | PRN
  Administered 2021-11-21: 10 via INTRAVENOUS

## 2021-11-21 MED ORDER — REGADENOSON 0.4 MG/5ML IV SOLN
INTRAVENOUS | Status: AC
  Administered 2021-11-21: 0.4 mg via INTRAVENOUS
  Filled 2021-11-21: qty 5

## 2021-11-21 MED ORDER — REGADENOSON 0.4 MG/5ML IV SOLN
0.4000 mg | Freq: Once | INTRAVENOUS | Status: AC
  Filled 2021-11-21: qty 5

## 2021-11-21 NOTE — Discharge Summary (Signed)
Physician Discharge Summary  AFAF WARNS JME:[id number] DOB: August 03, 1951 DOA: 11/20/2021  PCP: Yolanda Manges, DO  Admit date: 11/20/2021 Discharge date: 11/21/2021  Time spent: 35 minutes  Recommendations for Outpatient Follow-up:  Follow-up with primary care physician in 1 week  Discharge Diagnoses:  Principal Problem:   Chest pain Active Problems:   Unstable angina (HCC)   Primary hypertension   Pure hypercholesterolemia   Coronary artery disease due to lipid rich plaque   Discharge Condition: Stable   Filed Weights   11/20/21 0128  Weight: 71.7 kg    History of present illness:  Susan Marquez is a 71 y.o. female with medical history significant of CAD with CABG, PVD chronic claudication, IIDM, refractory HTN, HLD, came with 3 days of left-sided chest pain shoulder pain and arm pain.   Patient started to develop dull like lower back pain 3 days ago, localized, denied any urine or bowel movement problem no leg weakness.  Almost the same time, patient developed a exertional related left-sided chest pain radiated to left shoulder left side of neck and left upper arm with some tingling sensation of left forearm.  Episodic, 5 to 10 minutes each episode, associated with exertion, relieved by resting.  She described chest pain as " vague and annoying like", some episodes associated with feeling lightheadedness, denies any sweating nauseous vomiting or shortness of breath.  She says she also had 1 episode in the middle of the night 2 days ago.    She used to follow up with [facility] but recently switched due to insurance changes.  She had a normal stress test in 2020, but she does not remember whether she the stress test was because of chest pains.    She has chronic claudications follow-up with vascular at [facility], her symptoms has been stable.  ED Course: Blood pressure significant elevated, no tachycardia.  Troponin negative x2, EKG no acute ST changes.  Hospital Course:     Chest pain -Differential including angina and also suspicious for subclavian steal syndrome. -She had CAD and CABG more than 20 years ago.  ACS ruled out with negative troponin x2 and no acute EKG changes.  Discussed with cardiology, echo ordered and normal -Other Ddx, dissection unlikely given the history and clinical feature and equal pulses. -Cardiac stress testing negative -Carotid Dopplers negative for subclavian steal syndrome   HTN, uncontrolled -Continue amlodipine, continue HCTZ and lisinopril -Change metoprolol to Coreg and further titrate according to response.   Back pain -Acute, no red flag signs -Lumbar spine x-ray showed diffused degeneration changes.  Recommend p.o. pain management and outpatient follow-up with PCP.   PVD -Continue statin and aspirin   HLD -Continue statin   Discharge Exam: Vitals:   11/21/21 1221 11/21/21 1224  BP: (!) 121/93 (!) 121/93  Pulse:  69  Resp:    Temp:  98.3 F (36.8 C)  SpO2:      General: Alert and oriented no apparent distress Cardiovascular: Regular rate rhythm without murmurs rubs or gallops Respiratory: Clear to auscultation bilateral no wheezes rhonchi or rales  Discharge Instructions   Discharge Instructions     Diet - low sodium heart healthy   Complete by: As directed    Discharge instructions   Complete by: As directed    Follow up with primary care physician in 1 to 2 weeks   Increase activity slowly   Complete by: As directed       Allergies as of 11/21/2021  Reactions   Amlodipine Other (See Comments)   Chest pain - happened when pt first began taking. Has taken for the last year with no issues.    Semaglutide Nausea Only        Medication List     TAKE these medications    acetaminophen 650 MG CR tablet Commonly known as: TYLENOL Take 1,300 mg by mouth every 8 (eight) hours as needed for pain.   amLODipine 10 MG tablet Commonly known as: NORVASC Take 10 mg by mouth daily.    aspirin 325 MG tablet Take 325 mg by mouth at bedtime.   atorvastatin 20 MG tablet Commonly known as: LIPITOR Take 20 mg by mouth at bedtime.   ferrous sulfate 325 (65 FE) MG tablet Take 325 mg by mouth daily with breakfast.   hydrochlorothiazide 25 MG tablet Commonly known as: HYDRODIURIL Take 25 mg by mouth daily.   ibuprofen 200 MG tablet Commonly known as: ADVIL Take 400-600 mg by mouth every 6 (six) hours as needed for mild pain.   lisinopril 40 MG tablet Commonly known as: ZESTRIL Take 40 mg by mouth daily.   metFORMIN 500 MG 24 hr tablet Commonly known as: GLUCOPHAGE-XR Take 1,000 mg by mouth 2 (two) times daily.   metoprolol succinate 100 MG 24 hr tablet Commonly known as: TOPROL-XL Take 100 mg by mouth daily.   nitroGLYCERIN 0.4 MG SL tablet Commonly known as: NITROSTAT Place 0.4 mg under the tongue every 5 (five) minutes as needed for chest pain.   omeprazole 20 MG capsule Commonly known as: PRILOSEC Take 20 mg by mouth daily as needed (heartburn).   Victoza 18 MG/3ML Sopn Generic drug: liraglutide Inject 0.6 mg into the skin daily.   VITAMIN B-12 PO Take 1 tablet by mouth daily.   VITAMIN D3 PO Take 1 tablet by mouth daily.       Allergies  Allergen Reactions   Amlodipine Other (See Comments)    Chest pain - happened when pt first began taking. Has taken for the last year with no issues.    Semaglutide Nausea Only      The results of significant diagnostics from this hospitalization (including imaging, microbiology, ancillary and laboratory) are listed below for reference.    Significant Diagnostic Studies: DG Lumbar Spine Complete  Result Date: 11/20/2021 CLINICAL DATA:  Low back pain EXAM: LUMBAR SPINE - COMPLETE 4+ VIEW COMPARISON:  None. FINDINGS: Generalized lumbar spine degeneration with disc collapse and endplate spurring greatest at L3-4 and L4-5. No evidence of fracture, bone lesion, or endplate erosion. Osteopenia and extensive  atheromatous calcification. 9 mm stone projecting over the right kidney. IMPRESSION: No acute or focal finding. Generalized lumbar spine degeneration. Electronically Signed   By: Tiburcio Pea M.D.   On: 11/20/2021 04:38   NM Myocar Multi W/Spect W/Wall Motion / EF  Result Date: 11/21/2021   The study is normal. The study is low risk.   No ST deviation was noted.   There is subtle decreased radiotracer uptake in the inferolateral position seen at rest but improved at stress.  Overall no evidence of significant ischemia identified.   Left ventricular function is normal. Nuclear stress EF: 66 %. The left ventricular ejection fraction is hyperdynamic (>65%). End diastolic cavity size is normal. End systolic cavity size is normal.   VAS [place] UPPER EXTREMITY ARTERIAL DUPLEX  Result Date: 11/21/2021  UPPER EXTREMITY DUPLEX STUDY Patient Name:  Susan Marquez  Date of Exam:   11/21/2021 Medical Rec #:  [id number]       Accession #:    [id number] Date of Birth: [birth year]        Patient Gender: F Patient Age:   5 years Exam Location:  [facility] Procedure:      VAS [place] UPPER EXTREMITY ARTERIAL DUPLEX Referring Phys: TRACI TURNER --------------------------------------------------------------------------------  Indications: Left arm and neck pain.  Risk Factors: Hypertension, Diabetes, coronary artery disease. Comparison Study: No prior study Performing Technologist: Gertie Fey MHA, RDMS, RVT, RDCS  Examination Guidelines: A complete evaluation includes B-mode imaging, spectral Doppler, color Doppler, and power Doppler as needed of all accessible portions of each vessel. Bilateral testing is considered an integral part of a complete examination. Limited examinations for reoccurring indications may be performed as noted.  Left Doppler Findings: +---------------+----------+--------+--------+--------+  Site            PSV (cm/s) Waveform Stenosis Comments   +---------------+----------+--------+--------+--------+  Subclavian Prox 154        biphasic                    +---------------+----------+--------+--------+--------+  Subclavian Dist 140        biphasic                    +---------------+----------+--------+--------+--------+  Axillary        124        biphasic                    +---------------+----------+--------+--------+--------+  Brachial Prox   137        biphasic                    +---------------+----------+--------+--------+--------+  Brachial Dist   150        biphasic                    +---------------+----------+--------+--------+--------+  Radial Prox     90         biphasic                    +---------------+----------+--------+--------+--------+  Radial Dist     124        biphasic                    +---------------+----------+--------+--------+--------+  Ulnar Prox      83         biphasic                    +---------------+----------+--------+--------+--------+  Ulnar Dist      130        biphasic                    +---------------+----------+--------+--------+--------+   Summary:  Left: No obstruction visualized in the left upper extremity. *See table(s) above for measurements and observations. Electronically signed by Sherald Hess MD on 11/21/2021 at 2:33:46 PM.    Final    ECHOCARDIOGRAM COMPLETE  Result Date: 11/20/2021    ECHOCARDIOGRAM REPORT   Patient Name:   Susan Marquez Date of Exam: 11/20/2021 Medical Rec #:  [id number]      Height:       64.0 in Accession #:    [id number]     Weight:       158.0 lb Date of Birth:  1951-07-27       BSA:          1.770 m Patient Age:  70 years       BP:           00/00 mmHg Patient Gender: F              HR:           69 bpm. Exam Location:  Inpatient Procedure: 2D Echo Indications:    Chest pain  History:        Patient has no prior history of Echocardiogram examinations.                 Risk Factors:Diabetes.  Sonographer:    Eduard Roux Referring Phys: [id number] PING T ZHANG  IMPRESSIONS  1. Left ventricular ejection fraction, by estimation, is 55 to 60%. The left ventricle has normal function. The left ventricle has no regional wall motion abnormalities. There is mild left ventricular hypertrophy. Left ventricular diastolic parameters are consistent with Grade I diastolic dysfunction (impaired relaxation). Elevated left atrial pressure.  2. Right ventricular systolic function is normal. The right ventricular size is normal. There is mildly elevated pulmonary artery systolic pressure.  3. The mitral valve is normal in structure. Mild mitral valve regurgitation. No evidence of mitral stenosis.  4. The aortic valve is tricuspid. Aortic valve regurgitation is trivial. No aortic stenosis is present.  5. The inferior vena cava is normal in size with greater than 50% respiratory variability, suggesting right atrial pressure of 3 mmHg. Comparison(s): No prior Echocardiogram. FINDINGS  Left Ventricle: Left ventricular ejection fraction, by estimation, is 55 to 60%. The left ventricle has normal function. The left ventricle has no regional wall motion abnormalities. The left ventricular internal cavity size was normal in size. There is  mild left ventricular hypertrophy. Left ventricular diastolic parameters are consistent with Grade I diastolic dysfunction (impaired relaxation). Elevated left atrial pressure. Right Ventricle: The right ventricular size is normal. Right ventricular systolic function is normal. There is mildly elevated pulmonary artery systolic pressure. The tricuspid regurgitant velocity is 2.82 m/s, and with an assumed right atrial pressure of 5 mmHg, the estimated right ventricular systolic pressure is 36.8 mmHg. Left Atrium: Left atrial size was normal in size. Right Atrium: Right atrial size was normal in size. Pericardium: There is no evidence of pericardial effusion. Mitral Valve: The mitral valve is normal in structure. Mild mitral valve regurgitation. No evidence of  mitral valve stenosis. Tricuspid Valve: The tricuspid valve is normal in structure. Tricuspid valve regurgitation is mild . No evidence of tricuspid stenosis. Aortic Valve: The aortic valve is tricuspid. Aortic valve regurgitation is trivial. No aortic stenosis is present. Aortic valve peak gradient measures 10.8 mmHg. Pulmonic Valve: The pulmonic valve was normal in structure. Pulmonic valve regurgitation is trivial. No evidence of pulmonic stenosis. Aorta: The aortic root is normal in size and structure. Venous: The inferior vena cava is normal in size with greater than 50% respiratory variability, suggesting right atrial pressure of 3 mmHg. IAS/Shunts: No atrial level shunt detected by color flow Doppler.  LEFT VENTRICLE PLAX 2D LVIDd:         3.90 cm   Diastology LVIDs:         2.80 cm   LV e' medial:    5.70 cm/s LV PW:         1.10 cm   LV E/e' medial:  20.0 LV IVS:        1.30 cm   LV e' lateral:   6.75 cm/s LVOT diam:     1.90 cm   LV E/e'  lateral: 16.9 LV SV:         71 LV SV Index:   40 LVOT Area:     2.84 cm  RIGHT VENTRICLE            IVC RV Basal diam:  3.00 cm    IVC diam: 2.00 cm RV Mid diam:    3.70 cm RV S prime:     7.58 cm/s TAPSE (M-mode): 1.6 cm LEFT ATRIUM           Index        RIGHT ATRIUM           Index LA diam:      3.70 cm 2.09 cm/m   RA Area:     14.80 cm LA Vol (A2C): 37.8 ml 21.36 ml/m  RA Volume:   37.40 ml  21.13 ml/m LA Vol (A4C): 44.6 ml 25.20 ml/m  AORTIC VALVE                 PULMONIC VALVE AV Area (Vmax): 1.96 cm     PV Vmax:       0.94 m/s AV Vmax:        164.50 cm/s  PV Peak grad:  3.6 mmHg AV Peak Grad:   10.8 mmHg LVOT Vmax:      114.00 cm/s LVOT Vmean:     68.900 cm/s LVOT VTI:       0.251 m  AORTA Ao Root diam: 2.80 cm Ao Asc diam:  2.80 cm MITRAL VALVE                TRICUSPID VALVE MV Area (PHT): 3.95 cm     TR Peak grad:   31.8 mmHg MV Decel Time: 192 msec     TR Vmax:        282.00 cm/s MV E velocity: 114.00 cm/s MV A velocity: 115.00 cm/s  SHUNTS MV E/A ratio:   0.99         Systemic VTI:  0.25 m                             Systemic Diam: 1.90 cm Olga MillersBrian Crenshaw MD Electronically signed by Olga MillersBrian Crenshaw MD Signature Date/Time: 11/20/2021/3:43:01 PM    Final    VAS US CAROTID  Result Date: 11/21/2021 Carotid Arterial Duplex Study Patient Name:  Susan Marquez  Date of Exam:   11/21/2021 Medical Rec #: [id number]       Accession #:    [id number] Date of Birth: 09/17/1951        Patient Gender: F Patient Age:   [age] years [facility] Procedure:      VAS US CAROTID Referring Phys: Gloris ManchesterRACI TURNER --------------------------------------------------------------------------------  Indications:       Neck pain. Risk Factors:      Hypertension, Diabetes, coronary artery disease. Comparison Study:  No prior study Performing Technologist: Gertie FeyMichelle Simonetti MHA, RDMS, RVT, RDCS  Examination Guidelines: A complete evaluation includes B-mode imaging, spectral Doppler, color Doppler, and power Doppler as needed of all accessible portions of each vessel. Bilateral testing is considered an integral part of a complete examination. Limited examinations for reoccurring indications may be performed as noted.  Right Carotid Findings: +----------+--------+--------+--------+------------------+------------------+             PSV cm/s EDV cm/s Stenosis Plaque Description Comments            +----------+--------+--------+--------+------------------+------------------+  CCA Prox  137      17                                                       +----------+--------+--------+--------+------------------+------------------+  CCA Distal 100      16                                   intimal thickening  +----------+--------+--------+--------+------------------+------------------+  ICA Prox   139      26                heterogenous                           +----------+--------+--------+--------+------------------+------------------+  ICA Distal 122      26                                                        +----------+--------+--------+--------+------------------+------------------+  ECA        121      8                                                        +----------+--------+--------+--------+------------------+------------------+ +----------+--------+-------+----------------+-------------------+             PSV cm/s EDV cms Describe         Arm Pressure (mmHG)  +----------+--------+-------+----------------+-------------------+  Subclavian 174              Multiphasic, WNL                      +----------+--------+-------+----------------+-------------------+ +---------+--------+---+--------+--+---------+  Vertebral PSV cm/s 102 EDV cm/s 23 Antegrade  +---------+--------+---+--------+--+---------+  Left Carotid Findings: +----------+--------+--------+--------+------------------+--------+             PSV cm/s EDV cm/s Stenosis Plaque Description Comments  +----------+--------+--------+--------+------------------+--------+  CCA Prox   181      15                                             +----------+--------+--------+--------+------------------+--------+  CCA Distal 120      20                                             +----------+--------+--------+--------+------------------+--------+  ICA Prox   63       10                                             +----------+--------+--------+--------+------------------+--------+  ICA Distal 103      24                                             +----------+--------+--------+--------+------------------+--------+  ECA        124      9                                              +----------+--------+--------+--------+------------------+--------+ +----------+--------+--------+----------------+-------------------+             PSV cm/s EDV cm/s Describe         Arm Pressure (mmHG)  +----------+--------+--------+----------------+-------------------+  Subclavian 187               Multiphasic, WNL                       +----------+--------+--------+----------------+-------------------+ +---------+--------+--+--------+--+---------+  Vertebral PSV cm/s 68 EDV cm/s 15 Antegrade  +---------+--------+--+--------+--+---------+   Summary: Right Carotid: The extracranial vessels were near-normal with only minimal wall                thickening or plaque. Left Carotid: The extracranial vessels were near-normal with only minimal wall               thickening or plaque. Vertebrals:  Bilateral vertebral arteries demonstrate antegrade flow. Subclavians: Normal flow hemodynamics were seen in bilateral subclavian              arteries. *See table(s) above for measurements and observations.  Electronically signed by Sherald Hesshristopher Clark MD on 11/21/2021 at 2:34:11 PM.    Final     Microbiology: Recent Results (from the past 240 hour(s))  Resp Panel by RT-PCR (Flu A&B, Covid) Nasopharyngeal Swab     Status: None   Collection Time: 11/20/21  6:14 AM   Specimen: Nasopharyngeal Swab; Nasopharyngeal(NP) swabs in vial transport medium  Result Value Ref Range Status   SARS Coronavirus 2 by RT PCR NEGATIVE NEGATIVE Final    Comment: (NOTE) SARS-CoV-2 target nucleic acids are NOT DETECTED.  The SARS-CoV-2 RNA is generally detectable in upper respiratory specimens during the acute phase of infection. The lowest concentration of SARS-CoV-2 viral copies this assay can detect is 138 copies/mL. A negative result does not preclude SARS-Cov-2 infection and should not be used as the sole basis for treatment or other patient management decisions. A negative result may occur with  improper specimen collection/handling, submission of specimen other than nasopharyngeal swab, presence of viral mutation(s) within the areas targeted by this assay, and inadequate number of viral copies(<138 copies/mL). A negative result must be combined with clinical observations, patient history, and epidemiological information. The expected result is  Negative.  Fact Sheet for Patients:  BloggerCourse.comhttps://www.fda.gov/media/152166/download  Fact Sheet for Healthcare Providers:  SeriousBroker.ithttps://www.fda.gov/media/152162/download  This test is no t yet approved or cleared by the [place] FDA and  has been authorized for detection and/or diagnosis of SARS-CoV-2 by FDA under an Emergency Use Authorization (EUA). This EUA will remain  in effect (meaning this test can be used) for the duration of the COVID-19 declaration under Section 564(b)(1) of the Act, 21 U.S.C.section 360bbb-3(b)(1), unless the authorization is terminated  or revoked sooner.       Influenza A by PCR NEGATIVE NEGATIVE Final   Influenza B by PCR NEGATIVE NEGATIVE Final    Comment: (NOTE) The Xpert Xpress SARS-CoV-2/FLU/RSV plus assay is intended as an aid in the diagnosis of influenza from Nasopharyngeal swab specimens and should not be used as a sole basis for treatment. Nasal washings and aspirates are unacceptable for Xpert Xpress  SARS-CoV-2/FLU/RSV testing.  Fact Sheet for Patients: BloggerCourse.com  Fact Sheet for Healthcare Providers: SeriousBroker.it  This test is not yet approved or cleared by the [place] FDA and has been authorized for detection and/or diagnosis of SARS-CoV-2 by FDA under an Emergency Use Authorization (EUA). This EUA will remain in effect (meaning this test can be used) for the duration of the COVID-19 declaration under Section 564(b)(1) of the Act, 21 U.S.C. section 360bbb-3(b)(1), unless the authorization is terminated or revoked.  Performed at [facility], [address] [place], [place] [address]      Labs: Basic Metabolic Panel: Recent Labs  Lab 11/20/21 0429  NA 134*  K 4.1  CL 101  CO2 26  GLUCOSE 327*  BUN 19  CREATININE 0.79  CALCIUM 9.3   Liver Function Tests: No results for input(s): AST, ALT, ALKPHOS, BILITOT, PROT, ALBUMIN in the last 168  hours. No results for input(s): LIPASE, AMYLASE in the last 168 hours. No results for input(s): AMMONIA in the last 168 hours. CBC: Recent Labs  Lab 11/20/21 0429  WBC 8.6  NEUTROABS 4.9  HGB 11.6*  HCT 35.4*  MCV 85.9  PLT 288   Cardiac Enzymes: No results for input(s): CKTOTAL, CKMB, CKMBINDEX, TROPONINI in the last 168 hours. BNP: BNP (last 3 results) No results for input(s): BNP in the last 8760 hours.  ProBNP (last 3 results) No results for input(s): PROBNP in the last 8760 hours.  CBG: Recent Labs  Lab 11/20/21 1311 11/20/21 1652 11/20/21 2127 11/21/21 0747 11/21/21 1205  GLUCAP 190* 322* 142* 225* 252*       Signed:  Duglas Heier A MD.  [facility] 11/21/2021, 4:50 PM

## 2021-11-21 NOTE — Progress Notes (Signed)
Carotid artery duplex and left upper extremity arterial duplex completed. Refer to "CV Proc" under chart review to view preliminary results.  11/21/2021 2:01 PM Kelby Aline., MHA, RVT, RDCS, RDMS

## 2021-11-21 NOTE — Care Management Obs Status (Signed)
MEDICARE OBSERVATION STATUS NOTIFICATION   Patient Details  Name: Susan Marquez MRN: [id number] Date of Birth: 1950/11/21   Medicare Observation Status Notification Given:  Yes    Gala Lewandowsky, RN 11/21/2021, 10:11 AM

## 2021-11-21 NOTE — Progress Notes (Signed)
° °  Marty Heck presented for a nuclear stress test today.  No immediate complications.  Stress imaging is pending at this time.  Preliminary EKG findings may be listed in the chart, but the stress test result will not be finalized until perfusion imaging is complete.  1 day study, [facility] to read.  Theodore Demark, PA-C 11/21/2021, 11:08 AM

## 2021-11-21 NOTE — Progress Notes (Signed)
Inpatient Diabetes Program Recommendations  AACE/ADA: New Consensus Statement on Inpatient Glycemic Control (2015)  Target Ranges:  Prepandial:   less than 140 mg/dL      Peak postprandial:   less than 180 mg/dL (1-2 hours)      Critically ill patients:  140 - 180 mg/dL   Lab Results  Component Value Date   GLUCAP 252 (H) 11/21/2021   HGBA1C 9.4 (H) 11/20/2021    Review of Glycemic Control  Latest Reference Range & Units 11/21/21 07:47 11/21/21 12:05  Glucose-Capillary 70 - 99 mg/dL 194 (H) 174 (H)  (H): Data is abnormally high  Diabetes history: DM2  Outpatient Diabetes medications:  Toujeo 60 units QD Metformin 1000 mg BID To start Victoza this month  Current orders for Inpatient glycemic control: Novolog 0-15 units TID and 0-5 QHS, Metformin XR 1000 mg BID  Inpatient Diabetes Program Recommendations:    Please add Semglee 30 units QD (50% of home dose).  Reviewed patient's current A1c of 9.4% (average blood sugar of 223 mg/dL).   Explained what a A1c is and what it measures. Also reviewed goal A1c with patient, importance of good glucose control @ home, and blood sugar goals. She is current with her PCP.  Victoza was added by her PCP and she is waiting for it to arrive in the mail.  She follows a diabetic diet.    Will continue to follow while inpatient.  Thank you, Dulce Sellar, MSN, RN Diabetes Coordinator Inpatient Diabetes Program [phone] (team pager from 8a-5p)

## 2021-11-21 NOTE — Progress Notes (Signed)
Progress Note  Patient Name: Susan Marquez Date of Encounter: 11/21/2021  [facility] Cardiologist: None NEW  Subjective   Denies any further pain in arms.  No SOB.   Inpatient Medications    Scheduled Meds:  amLODipine  10 mg Oral Daily   aspirin  81 mg Oral Daily   atorvastatin  20 mg Oral QHS   carvedilol  25 mg Oral BID WC   cholecalciferol  1,000 Units Oral Daily   enoxaparin (LOVENOX) injection  40 mg Subcutaneous Q24H   ferrous sulfate  325 mg Oral Q breakfast   hydrochlorothiazide  25 mg Oral Daily   insulin aspart  0-15 Units Subcutaneous TID WC   insulin aspart  0-5 Units Subcutaneous QHS   liraglutide  0.6 mg Subcutaneous Daily   lisinopril  40 mg Oral Daily   metFORMIN  1,000 mg Oral BID WC   Continuous Infusions:  PRN Meds: acetaminophen, ibuprofen, ondansetron (ZOFRAN) IV   Vital Signs    Vitals:   11/20/21 1801 11/20/21 2118 11/21/21 0031 11/21/21 0330  BP: (!) 129/56 (!) 105/57 (!) 100/49 (!) 107/58  Pulse: 68 68 69 62  Resp:  15 16 17   Temp:  98.3 F (36.8 C) 97.9 F (36.6 C) 97.7 F (36.5 C)  TempSrc:  Oral Oral Oral  SpO2:  98% 98% 98%  Weight:      Height:       No intake or output data in the 24 hours ending 11/21/21 0735 Last 3 Weights 11/20/2021 12/31/2015 12/30/2015  Weight (lbs) 158 lb 158 lb 9.6 oz 162 lb 1.6 oz  Weight (kg) 71.668 kg 71.94 kg 73.528 kg      Telemetry    NSR - Personally Reviewed  ECG    No new EKG - Personally Reviewed  Physical Exam   GEN: No acute distress.   Neck: No JVD Cardiac: RRR, no murmurs, rubs, or gallops.  Respiratory: Clear to auscultation bilaterally. GI: Soft, nontender, non-distended  MS: No edema; No deformity. Neuro:  Nonfocal  Psych: Normal affect   Labs    High Sensitivity Troponin:   Recent Labs  Lab 11/20/21 0429 11/20/21 0614  TROPONINIHS 10 12      Chemistry Recent Labs  Lab 11/20/21 0429  NA 134*  K 4.1  CL 101  CO2 26  GLUCOSE 327*  BUN 19  CREATININE  0.79  CALCIUM 9.3  GFRNONAA >60  ANIONGAP 7     Hematology Recent Labs  Lab 11/20/21 0429  WBC 8.6  RBC 4.12  HGB 11.6*  HCT 35.4*  MCV 85.9  MCH 28.2  MCHC 32.8  RDW 13.6  PLT 288    BNPNo results for input(s): BNP, PROBNP in the last 168 hours.   DDimer No results for input(s): DDIMER in the last 168 hours.   Radiology    DG Lumbar Spine Complete  Result Date: 11/20/2021 CLINICAL DATA:  Low back pain EXAM: LUMBAR SPINE - COMPLETE 4+ VIEW COMPARISON:  None. FINDINGS: Generalized lumbar spine degeneration with disc collapse and endplate spurring greatest at L3-4 and L4-5. No evidence of fracture, bone lesion, or endplate erosion. Osteopenia and extensive atheromatous calcification. 9 mm stone projecting over the right kidney. IMPRESSION: No acute or focal finding. Generalized lumbar spine degeneration. Electronically Signed   By: 11/22/2021 M.D.   On: 11/20/2021 04:38   ECHOCARDIOGRAM COMPLETE  Result Date: 11/20/2021    ECHOCARDIOGRAM REPORT   Patient Name:   Susan Marquez Date  of Exam: 11/20/2021 Medical Rec #:  [id number]      Height:       64.0 in Accession #:    [id number]     Weight:       158.0 lb Date of Birth:  04/17/1951       BSA:          1.770 m Patient Age:    71 years       BP:           00/00 mmHg Patient Gender: F              HR:           69 bpm. Exam Location:  Inpatient Procedure: 2D Echo Indications:    Chest pain  History:        Patient has no prior history of Echocardiogram examinations.                 Risk Factors:Diabetes.  Sonographer:    Eduard Rouxolleen Schwartz Referring Phys: [id number] PING T ZHANG IMPRESSIONS  1. Left ventricular ejection fraction, by estimation, is 55 to 60%. The left ventricle has normal function. The left ventricle has no regional wall motion abnormalities. There is mild left ventricular hypertrophy. Left ventricular diastolic parameters are consistent with Grade I diastolic dysfunction (impaired relaxation). Elevated left atrial  pressure.  2. Right ventricular systolic function is normal. The right ventricular size is normal. There is mildly elevated pulmonary artery systolic pressure.  3. The mitral valve is normal in structure. Mild mitral valve regurgitation. No evidence of mitral stenosis.  4. The aortic valve is tricuspid. Aortic valve regurgitation is trivial. No aortic stenosis is present.  5. The inferior vena cava is normal in size with greater than 50% respiratory variability, suggesting right atrial pressure of 3 mmHg. Comparison(s): No prior Echocardiogram. FINDINGS  Left Ventricle: Left ventricular ejection fraction, by estimation, is 55 to 60%. The left ventricle has normal function. The left ventricle has no regional wall motion abnormalities. The left ventricular internal cavity size was normal in size. There is  mild left ventricular hypertrophy. Left ventricular diastolic parameters are consistent with Grade I diastolic dysfunction (impaired relaxation). Elevated left atrial pressure. Right Ventricle: The right ventricular size is normal. Right ventricular systolic function is normal. There is mildly elevated pulmonary artery systolic pressure. The tricuspid regurgitant velocity is 2.82 m/s, and with an assumed right atrial pressure of 5 mmHg, the estimated right ventricular systolic pressure is 36.8 mmHg. Left Atrium: Left atrial size was normal in size. Right Atrium: Right atrial size was normal in size. Pericardium: There is no evidence of pericardial effusion. Mitral Valve: The mitral valve is normal in structure. Mild mitral valve regurgitation. No evidence of mitral valve stenosis. Tricuspid Valve: The tricuspid valve is normal in structure. Tricuspid valve regurgitation is mild . No evidence of tricuspid stenosis. Aortic Valve: The aortic valve is tricuspid. Aortic valve regurgitation is trivial. No aortic stenosis is present. Aortic valve peak gradient measures 10.8 mmHg. Pulmonic Valve: The pulmonic valve was  normal in structure. Pulmonic valve regurgitation is trivial. No evidence of pulmonic stenosis. Aorta: The aortic root is normal in size and structure. Venous: The inferior vena cava is normal in size with greater than 50% respiratory variability, suggesting right atrial pressure of 3 mmHg. IAS/Shunts: No atrial level shunt detected by color flow Doppler.  LEFT VENTRICLE PLAX 2D LVIDd:         3.90 cm   Diastology LVIDs:  2.80 cm   LV e' medial:    5.70 cm/s LV PW:         1.10 cm   LV E/e' medial:  20.0 LV IVS:        1.30 cm   LV e' lateral:   6.75 cm/s LVOT diam:     1.90 cm   LV E/e' lateral: 16.9 LV SV:         71 LV SV Index:   40 LVOT Area:     2.84 cm  RIGHT VENTRICLE            IVC RV Basal diam:  3.00 cm    IVC diam: 2.00 cm RV Mid diam:    3.70 cm RV S prime:     7.58 cm/s TAPSE (M-mode): 1.6 cm LEFT ATRIUM           Index        RIGHT ATRIUM           Index LA diam:      3.70 cm 2.09 cm/m   RA Area:     14.80 cm LA Vol (A2C): 37.8 ml 21.36 ml/m  RA Volume:   37.40 ml  21.13 ml/m LA Vol (A4C): 44.6 ml 25.20 ml/m  AORTIC VALVE                 PULMONIC VALVE AV Area (Vmax): 1.96 cm     PV Vmax:       0.94 m/s AV Vmax:        164.50 cm/s  PV Peak grad:  3.6 mmHg AV Peak Grad:   10.8 mmHg LVOT Vmax:      114.00 cm/s LVOT Vmean:     68.900 cm/s LVOT VTI:       0.251 m  AORTA Ao Root diam: 2.80 cm Ao Asc diam:  2.80 cm MITRAL VALVE                TRICUSPID VALVE MV Area (PHT): 3.95 cm     TR Peak grad:   31.8 mmHg MV Decel Time: 192 msec     TR Vmax:        282.00 cm/s MV E velocity: 114.00 cm/s MV A velocity: 115.00 cm/s  SHUNTS MV E/A ratio:  0.99         Systemic VTI:  0.25 m                             Systemic Diam: 1.90 cm Olga Millers MD Electronically signed by Olga Millers MD Signature Date/Time: 11/20/2021/3:43:01 PM    Final     Cardiac Studies   2D echo 11/20/2021 IMPRESSIONS    1. Left ventricular ejection fraction, by estimation, is 55 to 60%. The  left ventricle has  normal function. The left ventricle has no regional  wall motion abnormalities. There is mild left ventricular hypertrophy.  Left ventricular diastolic parameters  are consistent with Grade I diastolic dysfunction (impaired relaxation).  Elevated left atrial pressure.   2. Right ventricular systolic function is normal. The right ventricular  size is normal. There is mildly elevated pulmonary artery systolic  pressure.   3. The mitral valve is normal in structure. Mild mitral valve  regurgitation. No evidence of mitral stenosis.   4. The aortic valve is tricuspid. Aortic valve regurgitation is trivial.  No aortic stenosis is present.   5. The inferior vena cava is normal in  size with greater than 50%  respiratory variability, suggesting right atrial pressure of 3 mmHg.   Comparison(s): No prior Echocardiogram.   Patient Profile     71 y.o. female  with a hx of peripheral arterial occlusive disease, CAD s/p CABG in 1994, type 2 DM, HTN, HLD, who is being seen 11/20/2021 for the evaluation of chest pain at the request of Dr. Chipper Herb.  Assessment & Plan    Left Neck and Arm Pain  Patient reports that pain is similar to pain experienced prior to CABG in 1993. Patient compared pain to arthritis. Worse on exertion, but also occurs at rest. Associated with SOB, palpitations. Patient is under some stress since sister passed away unexpectedly 1 month ago.  - Patient has a history of CABG in 1993. Denies any heart attacks or heart catheterizations since then. Had a normal stress test in January 2020 - EKG showed sinus rhythm, HR 66, prolonged QT interval (538 ms). No signs of ischemia  - HSTN 10>>12 - Echo showed normal LVF with mild LVH, G1DD and mild PHTN, mild MR - With negative troponins, no ischemic changes on EKG, this is not ACS. However, with patient having similar symptoms prior to CABG, may be worth ischemic eval. - Continue amlodipine 10 mg, hydrochlorothiazide 25 mg, metoprolol succinate  100 mg, lisinopril 40 mg for BP management  - Continue daily asa and statin - she is NPO for nuclear stress test to rule out ischemia   CAD  -s/p CABG 1993  -now having similar symptoms compared to prior to CABG with left arm pain and shoulder pain but somewhat different -continue ASA 81mg  daily, statin, BB  HTN -BP controlled on exam today -continue Amlodipine 10mg  daily, Carvedilol 25mg  BID, HCTZ 25mg  daily and Lisinopril 40mg  daily -SCr stable at 0.79  HLD -LDL goal < 70 -check FLP -continue Atorvastatin 20mg  daily   Otherwise per primary  - Low back pain  - DM      For questions or updates, please contact [facility] Please consult [email or web address] for contact info under        Signed, , MD  11/21/2021, 7:35 AM

## 2021-11-21 NOTE — Care Management (Signed)
°  Transition of Care [facility]) Screening Note   Patient Details  Name: Susan Marquez Date of Birth: 1950/12/31   Transition of Care [facility]) CM/SW Contact:    Bethena Roys, RN Phone Number: 11/21/2021, 10:13 AM    Transition of Care Department [facility]) has reviewed patient and no TOC needs have been identified at this time. We will continue to monitor patient advancement through interdisciplinary progression rounds. If new patient transition needs arise, please place a TOC consult.

## 2021-12-27 DEATH — deceased

## 2023-05-26 IMAGING — DX DG LUMBAR SPINE COMPLETE 4+V
5 series · 5 of 5 positions shown · non-contrast
Comparison: None.

CLINICAL DATA: Low back pain

EXAM:
LUMBAR SPINE - COMPLETE 4+ VIEW

[l-spine ap]
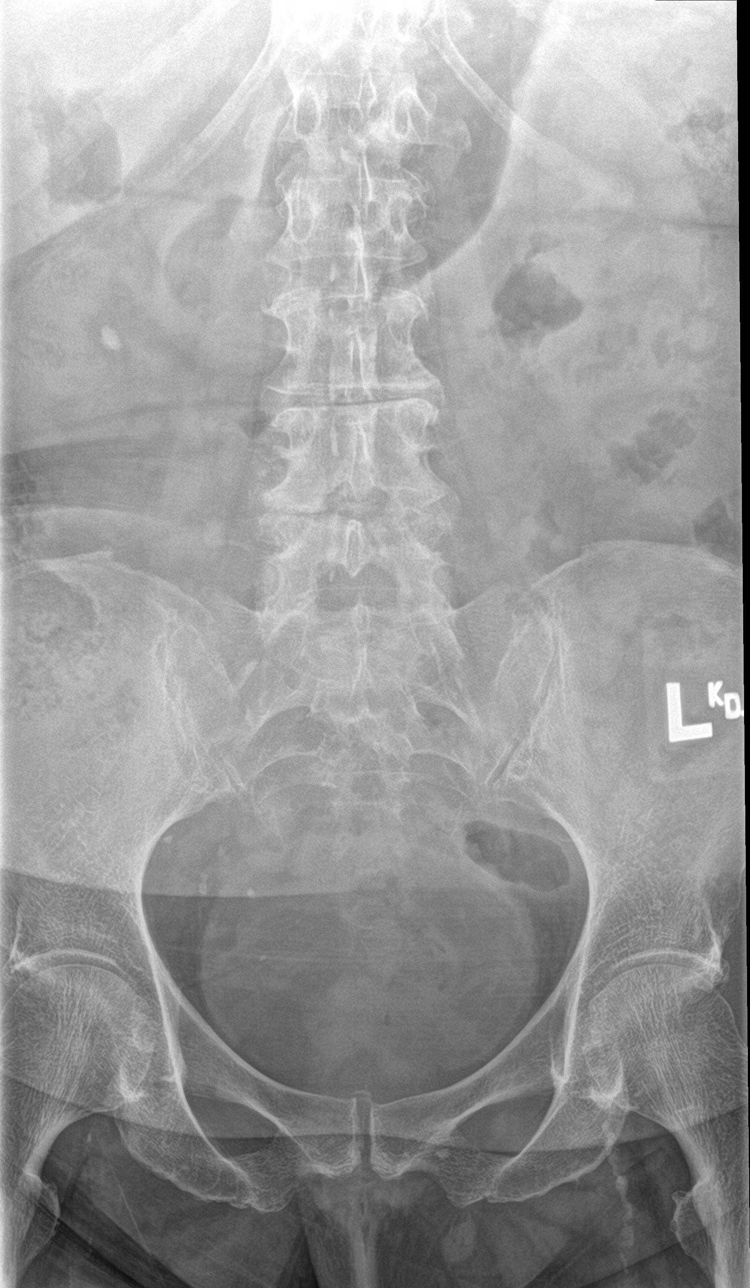

[l-spine obl (1 of 2)]
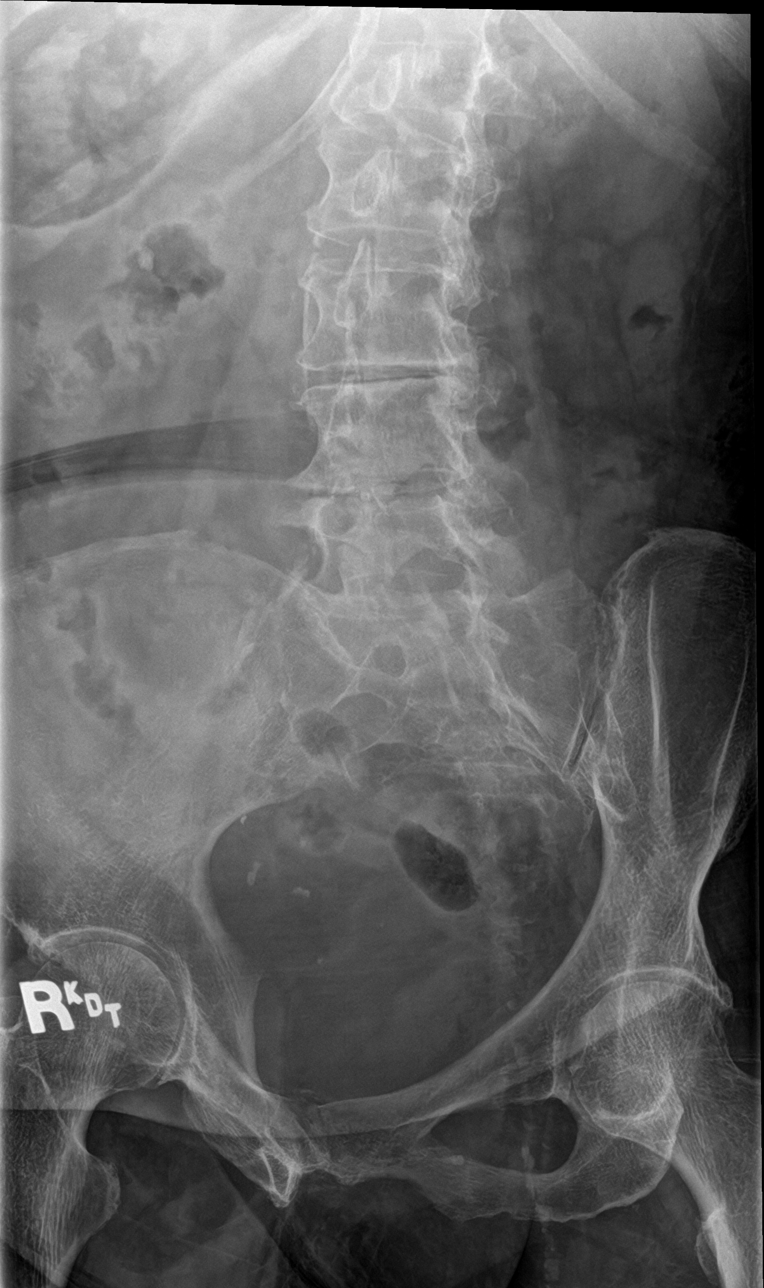

[l-spine obl (2 of 2)]
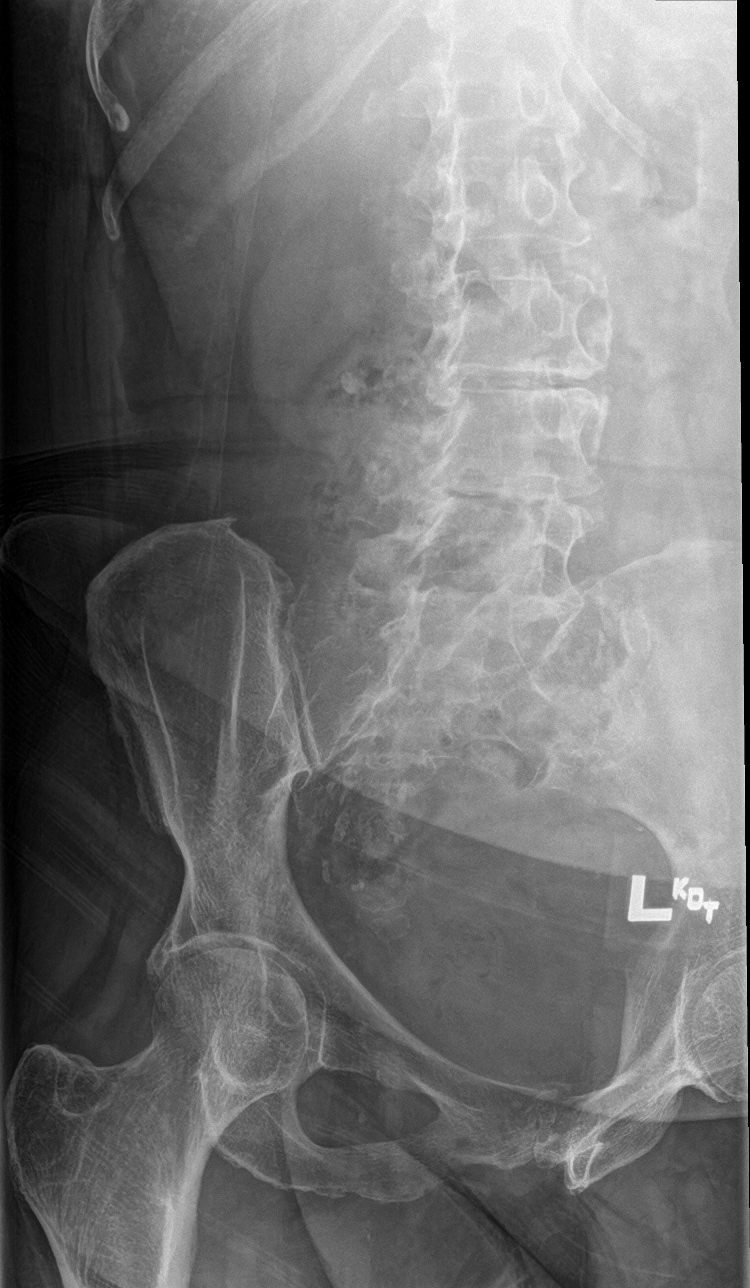

[l-spine lat]
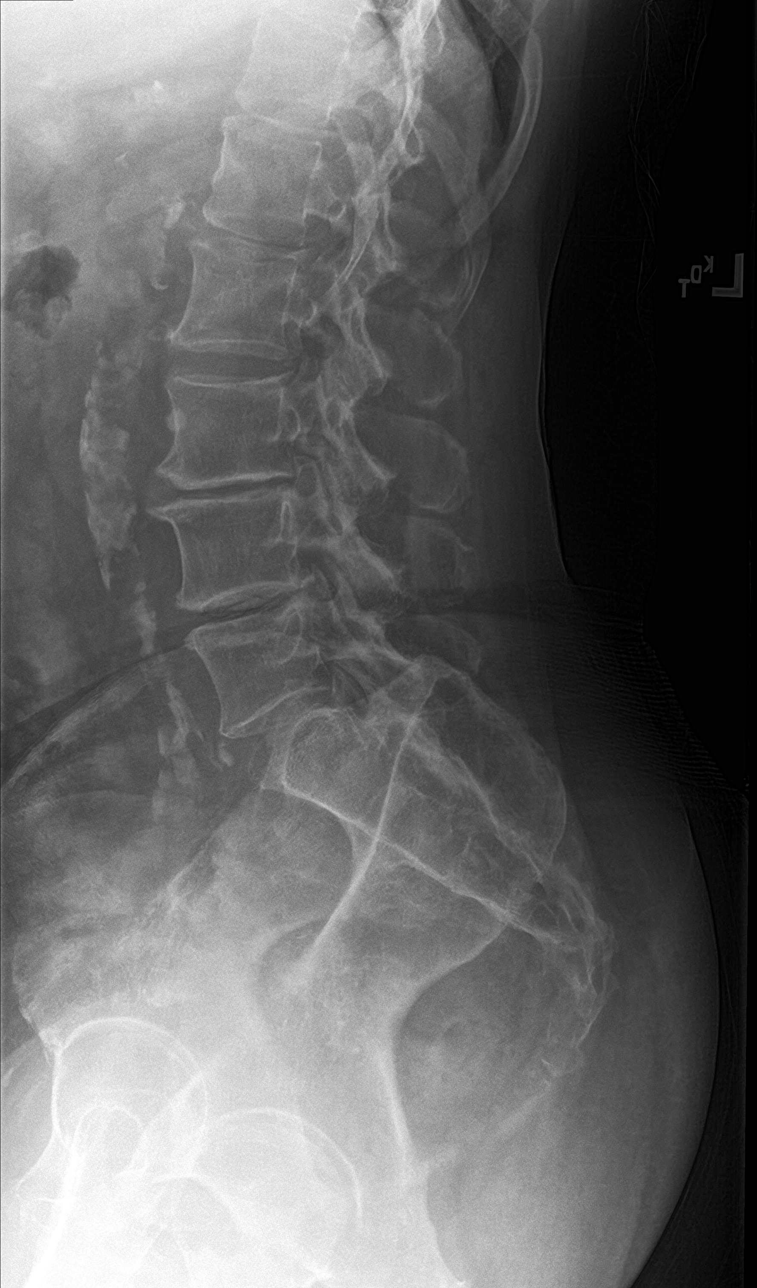

[l-spine spot]
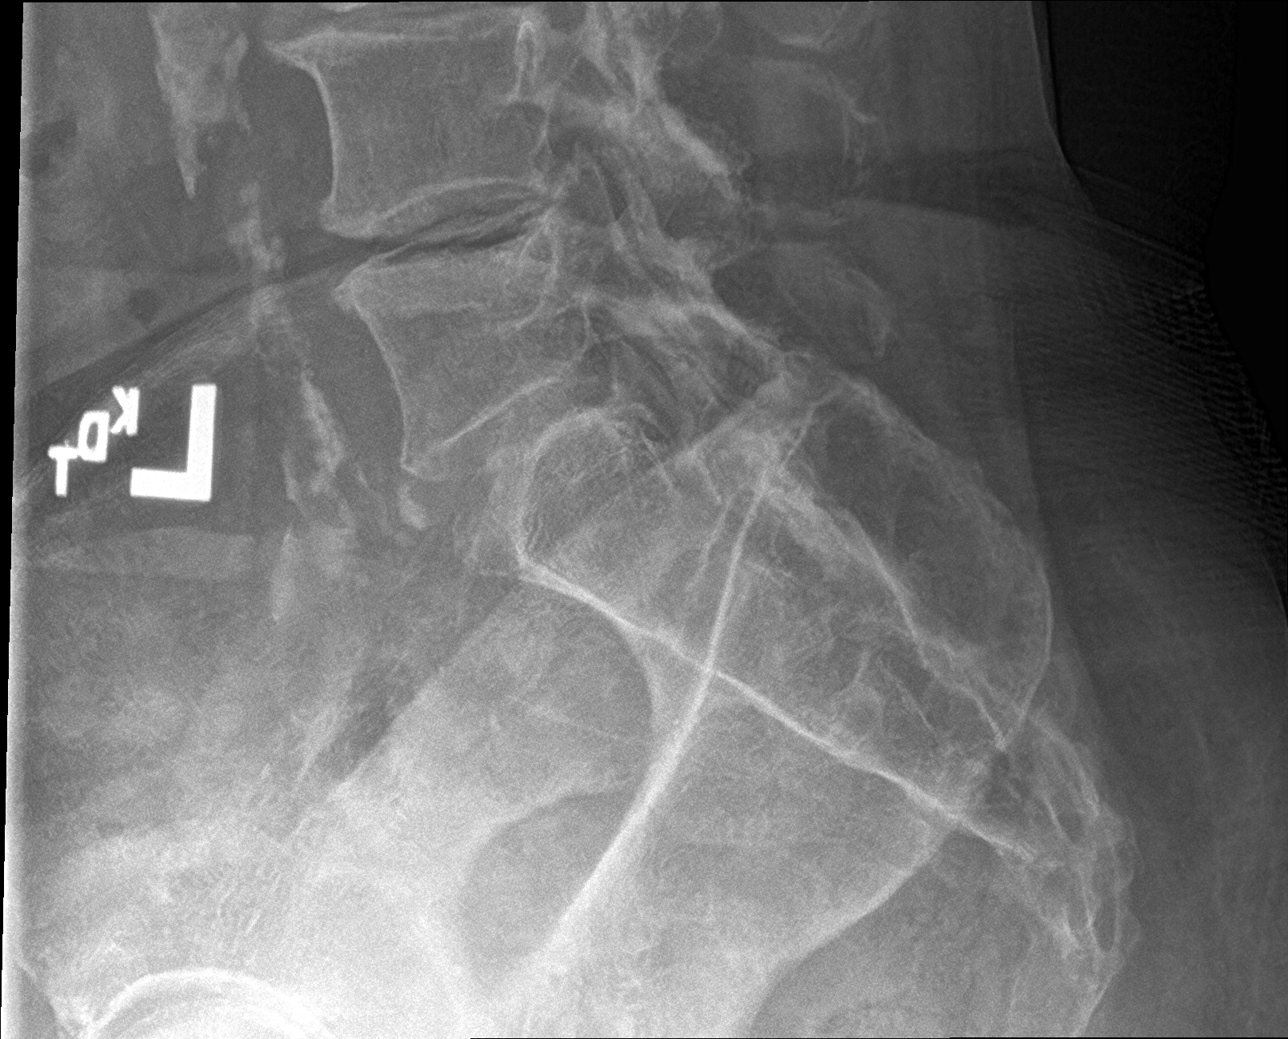

[5 of 5 positions shown; findings below may reference images not displayed]

FINDINGS: Generalized lumbar spine degeneration with disc collapse and
endplate spurring greatest at L3-4 and L4-5. No evidence of
fracture, bone lesion, or endplate erosion. Osteopenia and extensive
atheromatous calcification. 9 mm stone projecting over the right
kidney.
IMPRESSION: No acute or focal finding.

Generalized lumbar spine degeneration.
